# Patient Record
Sex: Female | Born: 1955 | Race: White | Hispanic: No | Smoking: Current every day smoker
Health system: Southern US, Community
[De-identification: ages and names within clinical notes are randomized; demographics above are authoritative.]

## PROBLEM LIST (undated history)

## (undated) DIAGNOSIS — F419 Anxiety disorder, unspecified: Secondary | ICD-10-CM

## (undated) DIAGNOSIS — J45909 Unspecified asthma, uncomplicated: Secondary | ICD-10-CM

## (undated) DIAGNOSIS — F1911 Other psychoactive substance abuse, in remission: Secondary | ICD-10-CM

## (undated) DIAGNOSIS — R0600 Dyspnea, unspecified: Secondary | ICD-10-CM

## (undated) DIAGNOSIS — I10 Essential (primary) hypertension: Secondary | ICD-10-CM

## (undated) DIAGNOSIS — R011 Cardiac murmur, unspecified: Secondary | ICD-10-CM

## (undated) DIAGNOSIS — E079 Disorder of thyroid, unspecified: Secondary | ICD-10-CM

## (undated) DIAGNOSIS — E039 Hypothyroidism, unspecified: Secondary | ICD-10-CM

## (undated) DIAGNOSIS — F319 Bipolar disorder, unspecified: Secondary | ICD-10-CM

## (undated) DIAGNOSIS — B1920 Unspecified viral hepatitis C without hepatic coma: Secondary | ICD-10-CM

## (undated) DIAGNOSIS — J189 Pneumonia, unspecified organism: Secondary | ICD-10-CM

## (undated) DIAGNOSIS — J449 Chronic obstructive pulmonary disease, unspecified: Secondary | ICD-10-CM

## (undated) HISTORY — PX: TONSILLECTOMY: SUR1361

## (undated) HISTORY — PX: CHOLECYSTECTOMY: SHX55

## (undated) HISTORY — DX: Essential (primary) hypertension: I10

## (undated) HISTORY — DX: Unspecified viral hepatitis C without hepatic coma: B19.20

## (undated) HISTORY — PX: TUBAL LIGATION: SHX77

## (undated) HISTORY — DX: Other psychoactive substance abuse, in remission: F19.11

## (undated) HISTORY — DX: Unspecified asthma, uncomplicated: J45.909

## (undated) HISTORY — DX: Anxiety disorder, unspecified: F41.9

## (undated) HISTORY — DX: Bipolar disorder, unspecified: F31.9

---

## 2005-11-25 ENCOUNTER — Emergency Department (HOSPITAL_COMMUNITY): Admission: EM | Admit: 2005-11-25 | Discharge: 2005-11-25 | Payer: Self-pay | Admitting: Emergency Medicine

## 2005-12-16 ENCOUNTER — Emergency Department (HOSPITAL_COMMUNITY): Admission: EM | Admit: 2005-12-16 | Discharge: 2005-12-16 | Payer: Self-pay | Admitting: Emergency Medicine

## 2005-12-20 ENCOUNTER — Emergency Department (HOSPITAL_COMMUNITY): Admission: EM | Admit: 2005-12-20 | Discharge: 2005-12-21 | Payer: Self-pay | Admitting: Emergency Medicine

## 2005-12-23 ENCOUNTER — Emergency Department (HOSPITAL_COMMUNITY): Admission: EM | Admit: 2005-12-23 | Discharge: 2005-12-23 | Payer: Self-pay | Admitting: Emergency Medicine

## 2009-09-01 ENCOUNTER — Other Ambulatory Visit: Admission: RE | Admit: 2009-09-01 | Discharge: 2009-09-01 | Payer: Self-pay | Admitting: Unknown Physician Specialty

## 2010-11-26 NOTE — Consult Note (Signed)
Monica [name], Monica [name]                 ACCOUNT NO.:  [id number]   MEDICAL RECORD NO.:  [id number]          PATIENT TYPE:  EMS   LOCATION:  ED                            FACILITY:  [facility]   PHYSICIAN:  Tilda Burrow, M.D. DATE OF BIRTH:  10-Nov-1955   DATE OF CONSULTATION:  DATE OF DISCHARGE:                                   CONSULTATION   CHIEF COMPLAINT:  Vaginal bleeding/fibroids.   HISTORY OF PRESENT ILLNESS:  This 55 year old female, with a 2-1/2 month  history of abdominal pain and discomfort, has been seen in the emergency  room several times, both at [facility] and [facility], as  well as single visit to [facility] 3+ weeks ago, to see Dr. Despina Hidden for  complaints of irregular bleeding which has been going on for 5 weeks now.  She perceives herself to be in a normal state of overall decent health up  until 2-1/2 months ago.  She began a period 5 weeks ago with irregular  bleeding.  The historian is the patient, who is garrulous and does not  appreciate being interrupted for clarification.  She has been seen at  [facility], as well.  Most recently, she was seen by Dr.  Despina Hidden 3 weeks ago.  She was treated with Megace for dysfunctional bleeding.  She claims to have lost her job at [employer] recently due to bleeding  and anemia.  She has begun to seek follow up at [facility].  She  made a follow up appointment today, and is scheduled to see Dr. Despina Hidden in 2  days.  She was seen Sunday at [facility] where an ultrasound was  performed.  Report not available at this time, but the patient  reports that  she was told she had fibroids.  She called this evening at the end of the  work day requesting pain medicines be called in other than Vicodin.   PAST MEDICAL HISTORY:  1.  Positive for hepatitis C, not being managed by medical personnel at this      time.  2.  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  3.  She  does give additional medical history of having elevated thyroid      function, not under any current therapy.   SURGICAL HISTORY:  Negative.   MEDICATIONS:  1.  Klonopin 1 mg twice daily.  2.  Lexapro 10 mg once daily.  3.  Hydrocodone one every 4 hours as needed.  4.  Vicodin.  5.  Megace.  6.  Megestrol 40 mg daily.  7.  Voltaren 100 mg one a day.   SOCIAL HISTORY:  The patient is a heavy smoker, over 2 packs per day.  She  has a history of drug addiction until 22 months ago.  She is reportedly  clean.  She denies the use of any drug, other than cocaine.  She does not  drink currently.   PHYSICAL EXAMINATION:  GENERAL:  A thin, somber Caucasian female, tanned,  who appears older tan her stated  age.  Estimated white count 130.  HEENT:  Pupils equal, round and reactive.  Poor dentition with multiple  teeth removed from the lower jaw.  NECK:  Supple.  No thyromegaly.  Breathing unlabored.  ABDOMEN:  Soft, nontender, without any masses or guarding.  PELVIC:  Exam performed by Dr. Margretta Ditty reported as normal external  genitalia, non-purulent cervix, light blood per vagina with uterus not  palpably enlarged on a patient whose size allows her to be easy to be  examined.  Exam does not corroborate with her complaints of discomfort.   LABORATORY DATA:  Normal liver function tests, normal white counts,  hemoglobin 13, hematocrit 37.9, white count 7,800.  Albumin is 4.0.  SGOT  38,  minimally elevated.  SGPT 41, minimally elevated.   IMPRESSION:  1.  Dysfunctional bleeding.  2.  Pain complaints out of proportion to physical findings.   PLAN:  Give the patient Tylox one every 6 hours x20 tablets.  The patient is  to discontinue Megace at this time x1 week, then restart if necessary.  A  follow up appointment previously scheduled for Dr. Despina Hidden in 2 days can be  postponed 1 week if she is doing well.  Follow up with Dr. Despina Hidden.  Also, the  patient advised to request ultrasound records from  December 18, 2005 at [facility], and get those delivered to [facility].      Tilda Burrow, M.D.  Electronically Signed     JVF/MEDQ  D:  12/21/2005  T:  12/21/2005  Job:  [id number]

## 2014-07-14 LAB — LIPID PANEL
Cholesterol: 160 (ref 0–200)
HDL: 58 (ref 35–70)
LDL CALC: 91
Triglycerides: 57 (ref 40–160)

## 2017-05-30 ENCOUNTER — Ambulatory Visit (INDEPENDENT_AMBULATORY_CARE_PROVIDER_SITE_OTHER): Payer: Medicaid Other | Admitting: General Surgery

## 2017-05-30 ENCOUNTER — Encounter: Payer: Self-pay | Admitting: General Surgery

## 2017-05-30 VITALS — BP 109/64 | HR 112 | Temp 98.4°F | Resp 18 | Ht 61.0 in | Wt 113.0 lb

## 2017-05-30 DIAGNOSIS — K8050 Calculus of bile duct without cholangitis or cholecystitis without obstruction: Secondary | ICD-10-CM | POA: Diagnosis not present

## 2017-05-30 NOTE — Patient Instructions (Signed)
You will be referred to Dr. Mannie Stabile for a new PCP. You will need further workup prior to getting any gallbladder surgery, including an MRI and possibly labs to look at your liver.   Cholelithiasis Cholelithiasis is also called "gallstones." It is a kind of gallbladder disease. The gallbladder is an organ that stores a liquid (bile) that helps you digest fat. Gallstones may not cause symptoms (may be silent gallstones) until they cause a blockage, and then they can cause pain (gallbladder attack). Follow these instructions at home:  Take over-the-counter and prescription medicines only as told by your doctor.  Stay at a healthy weight.  Eat healthy foods. This includes: ? Eating fewer fatty foods, like fried foods. ? Eating fewer refined carbs (refined carbohydrates). Refined carbs are breads and grains that are highly processed, like white bread and white rice. Instead, choose whole grains like whole-wheat bread and brown rice. ? Eating more fiber. Almonds, fresh fruit, and beans are healthy sources of fiber.  Keep all follow-up visits as told by your doctor. This is important. Contact a doctor if:  You have sudden pain in the upper right side of your belly (abdomen). Pain might spread to your right shoulder or your chest. This may be a sign of a gallbladder attack.  You feel sick to your stomach (are nauseous).  You throw up (vomit).  You have been diagnosed with gallstones that have no symptoms and you get: ? Belly pain. ? Discomfort, burning, or fullness in the upper part of your belly (indigestion). Get help right away if:  You have sudden pain in the upper right side of your belly, and it lasts for more than 2 hours.  You have belly pain that lasts for more than 5 hours.  You have a fever or chills.  You keep feeling sick to your stomach or you keep throwing up.  Your skin or the whites of your eyes turn yellow (jaundice).  You have dark-colored pee (urine).  You have  light-colored poop (stool). Summary  Cholelithiasis is also called "gallstones."  The gallbladder is an organ that stores a liquid (bile) that helps you digest fat.  Silent gallstones are gallstones that do not cause symptoms.  A gallbladder attack may cause sudden pain in the upper right side of your belly. Pain might spread to your right shoulder or your chest. If this happens, contact your doctor.  If you have sudden pain in the upper right side of your belly that lasts for more than 2 hours, get help right away. This information is not intended to replace advice given to you by your health care provider. Make sure you discuss any questions you have with your health care provider. Document Released: 12/14/2007 Document Revised: 03/13/2016 Document Reviewed: 03/13/2016 Elsevier Interactive Patient Education  2017 [employer].

## 2017-05-31 ENCOUNTER — Encounter: Payer: Self-pay | Admitting: General Surgery

## 2017-05-31 NOTE — Progress Notes (Signed)
[facility] History and Physical  Reason for Referral:. Gallstones  Referring Physician: Neale Burly, MD   Chief Complaint    Cholelithiasis      Monica [name] is a 61 y.o. female.  HPI: Monica [name] is a 61 yo with complaints of chronic abdominal pain, but on speaking with her in my office she was preoccupied with multiple other issues and complaints. She was sent me to discuss possible gallbladder surgery given her [place] with stones, and on getting her full history, she reported multiple other issues that concerned her more than the gallbladder. She reports dragging her left leg chronically, reports a history of Hepatitis C and wanting to get treatment, and reports a 8-10 lb weight loss in the recent months.  She says she goes to the restroom with diarrhea over 15 times a day, and that she has vomited on occasion and has seen hematemesis once.  She has been seen by Monica [name] for many years, but is worried that she needs a second opinion on some of her chronic health issues.    I tried to explain today that she was at my office to discuss her gallbladder, but it was difficult to redirect the conversation as she was very preoccupied with the other health issues.  She reports that she has never had a colonoscopy and I am not seeing a documented history of one in the notes from Monica [name].   Past Medical History:  Diagnosis Date  . Anxiety   . Asthma   . Hepatitis C   . Hypertension     Past Surgical History:  Procedure Laterality Date  . TUBAL LIGATION      Family History  Problem Relation Age of Onset  . Lung cancer Mother   . Heart disease Father   . Hypertension Sister   . Diabetes Maternal Grandmother   . Miscarriages / Stillbirths Maternal Grandmother     Social History   Tobacco Use  . Smoking status: Current Every Day Smoker  . Smokeless tobacco: Never Used  Substance Use Topics  . Alcohol use: Yes    Comment: occasional  . Drug use: No     Medications: I have reviewed the patient's current medications. Prior to Admission:  (Not in a hospital admission) Allergies as of 05/30/2017   Not on File     Medication List        Accurate as of 05/30/17 11:59 PM. Always use your most recent med list.          escitalopram 10 MG tablet Commonly known as:  LEXAPRO Take 10 mg by mouth daily.   gabapentin 300 MG capsule Commonly known as:  NEURONTIN Take 300 mg by mouth 3 (three) times daily.   levothyroxine 25 MCG tablet Commonly known as:  SYNTHROID, LEVOTHROID Take 25 mcg by mouth daily.   lisinopril-hydrochlorothiazide 20-12.5 MG tablet Commonly known as:  PRINZIDE,ZESTORETIC Take 1 tablet by mouth daily.   loperamide 2 MG capsule Commonly known as:  IMODIUM Take 2 mg by mouth 2 (two) times daily as needed.   PROAIR HFA 108 (90 Base) MCG/ACT inhaler Generic drug:  albuterol INHALE TWO PUFFS BY MOUTH EVERY 4 TO 6 HOURS AS NEEDED        ROS:  A comprehensive review of systems was negative except for: Respiratory: positive for SOB Cardiovascular: positive for chest pain and panic attacks Gastrointestinal: positive for abdominal pain, diarrhea, nausea, reflux symptoms and vomiting Genitourinary: positive for dysuria and  frequency Musculoskeletal: positive for back pain, bone pain, neck pain, stiff joints and dragging left leg?  Endocrine: positive for tired, excessive thrist, cold intolerance  Blood pressure 109/64, pulse (!) 112, temperature 98.4 F (36.9 C), resp. rate 18, height 5\' 1"  (1.549 m), weight 113 lb (51.3 kg). Physical Exam  Constitutional: She is oriented to person, place, and time. She appears distressed.  Thin lady, anxious  HENT:  Head: Normocephalic.  Eyes: Pupils are equal, round, and reactive to light.  Cardiovascular: Normal rate and regular rhythm.  Pulmonary/Chest: Effort normal and breath sounds normal.  Abdominal: Soft. She exhibits no distension. There is no tenderness.   Musculoskeletal: Normal range of motion.  Neurological: She is alert and oriented to person, place, and time.  Skin: Skin is warm and dry.  Psychiatric: Mood, memory, affect and judgment normal.  Vitals reviewed.   Results:     No LFTs sent / obtained from PCP  Assessment & Plan:  Monica [name] is a 61 y.o. female with multiple complaints and concerns who was sent to me for cholelithiasis but had anxiety about a multitude of other issues that need to be addressed by a primary physician.  On reviewing her history and [place] report, she does have stones a very dilated CBD to 9.48mm and no obvious stones or other lesions with her liver or pancreas.  I am concerned with her weight loss and her smoking history, diarrhea (Could be some degree of pancreatic insufficiency) and general frailty.  She also has multiple other complaints that I cannot address today, but she would like to be referred to a second opinion for a primary care provider.    -I have given her a list of options for PCPs in the [place] area that are taking patients, a referral was made to Monica [name] and I will let Monica [name] know about the patient's request for the referral  -Given the CBD diameter and other issues, MRCP will be ordered to ensure no additional findings concerning for a pancreatic cancer or choledocholithiasis  -Will hold on labs at this time, as Monica [name] is likely to obtain studies at her future appointment, and I will ask her to include LFTs at that time  -I will see the patient back in the future, once she has the MRCP and has been evaluated by Monica [name] for her other complaints which she was more preoccupied about   Future Appointments  Date Time Provider [place]  06/13/2017  9:00 AM Monica Macadam, MD [facility]    All questions were answered to the satisfaction of the patient.   Monica [name] 05/31/2017, 9:30 AM

## 2017-06-13 ENCOUNTER — Encounter: Payer: Self-pay | Admitting: Family Medicine

## 2017-06-13 ENCOUNTER — Ambulatory Visit (INDEPENDENT_AMBULATORY_CARE_PROVIDER_SITE_OTHER): Payer: Medicaid Other | Admitting: Family Medicine

## 2017-06-13 ENCOUNTER — Other Ambulatory Visit: Payer: Self-pay

## 2017-06-13 VITALS — BP 110/70 | HR 90 | Temp 98.2°F | Resp 16 | Ht 61.0 in | Wt 111.8 lb

## 2017-06-13 DIAGNOSIS — R197 Diarrhea, unspecified: Secondary | ICD-10-CM

## 2017-06-13 DIAGNOSIS — Z720 Tobacco use: Secondary | ICD-10-CM | POA: Diagnosis not present

## 2017-06-13 DIAGNOSIS — R5383 Other fatigue: Secondary | ICD-10-CM

## 2017-06-13 DIAGNOSIS — Z23 Encounter for immunization: Secondary | ICD-10-CM

## 2017-06-13 DIAGNOSIS — I10 Essential (primary) hypertension: Secondary | ICD-10-CM

## 2017-06-13 DIAGNOSIS — C44301 Unspecified malignant neoplasm of skin of nose: Secondary | ICD-10-CM

## 2017-06-13 DIAGNOSIS — Z9189 Other specified personal risk factors, not elsewhere classified: Secondary | ICD-10-CM

## 2017-06-13 DIAGNOSIS — E039 Hypothyroidism, unspecified: Secondary | ICD-10-CM | POA: Diagnosis not present

## 2017-06-13 DIAGNOSIS — B1920 Unspecified viral hepatitis C without hepatic coma: Secondary | ICD-10-CM

## 2017-06-13 NOTE — Progress Notes (Signed)
Patient ID: Monica [name], female    DOB: 12-16-1955, 61 y.o.   MRN: [id number]  Chief Complaint  Patient presents with  . Hepatitis C  . Thyroid Problem  . Hypertension    Allergies Patient has no allergy information on record.  Subjective:   Monica [name] is a 61 y.o. female who presents to [facility] today.  HPI Monica [name] presents as a new patient visit to our clinic.  She reports that approximately 10 years ago she found out she had hepatitis C viral infection.  She reports that she has not had any evaluation or treatment since that time.  She presents today after seeing Dr. Constance Haw for questionable gallstones versus common bile dilatation because she is in need of a new primary care physician.  She reports she is very concerned about her health and would like to make some good changes for her health and well-being.  She reports she is motivated to quit smoking and wants to do what she can to live longer.  However, she is not interested in quitting smoking today.  Reports she has been hypothyroid for many years.  Has been on thyroid medication each day as directed.  Is not sure when she had her last lab values checked but believes they were normal.  Reports that she has lost weight over the past several months.  Can feel anxious at times.  Has had diarrhea for the past 3 months multiple times a day.  Has had a diagnosis of hypertension for a long time.  Reports compliance with her medication.  Denies any known heart attack, stroke, or kidney disease secondary to blood pressure.  Denies any side effects with her medication.  Takes Lexapro for her mood.  Does not feel depressed but believes that it helps with anxiety.  Denies any suicidal or homicidal ideations.  Sleeps fairly well.  Energy is fair.  Denies problems with concentration.  Enjoys doing things with family and especially her grandchildren.  Appetite is lower than usual.  Reports that has always been able to eat  whatever she wants and not gain weight.  Does not do any formal exercise.  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  Has smoked for over 40 years greater than or equal to a pack per day.  Has tried Chantix in the past to quit but had significant psychiatric side effects with the medication.  Has used the nicotine replacement patch in the past but no success.  Has tried to quit multiple times.  Does not feel that this is the right time to quit because she is very stressed about her health.  Currently being evaluated by Dr. Constance Haw for gallstones and questionable area in her Bile duct.    Thyroid Problem  Presents for follow-up visit. Symptoms include diarrhea, fatigue and weight loss. Patient reports no constipation, depressed mood, diaphoresis, dry skin, hair loss, heat intolerance, hoarse voice, leg swelling, nail problem, palpitations, tremors, visual change or weight gain. The symptoms have been stable.  Hypertension  This is a chronic problem. The current episode started more than 1 year ago. The problem has been resolved since onset. The problem is controlled. Associated symptoms include anxiety. Pertinent negatives include no blurred vision, chest pain, headaches, malaise/fatigue, neck pain, orthopnea, palpitations, peripheral edema, PND, shortness of breath or sweats. There are no associated agents to hypertension. Risk factors for coronary artery disease include family history, post-menopausal state, smoking/tobacco exposure, stress and  sedentary lifestyle. Past treatments include ACE inhibitors and diuretics. The current treatment provides moderate improvement. Compliance problems include exercise.  There is no history of angina, kidney disease, CVA or PVD.  Anxiety  Presents for follow-up visit. Symptoms include insomnia. Patient reports no chest pain, confusion, decreased concentration, depressed mood, dizziness, dry mouth, excessive worry, hyperventilation,  irritability, malaise, palpitations, panic, restlessness, shortness of breath or suicidal ideas. Symptoms occur occasionally. The severity of symptoms is mild. The quality of sleep is fair. Nighttime awakenings: occasional.   Compliance with medications is 76-100%.  Nicotine Dependence  Presents for initial visit. Symptoms include cravings, fatigue, headache and insomnia. Symptoms are negative for decreased concentration, irritability and sore throat. (Currently no symptoms because he is currently smoking.  However reports she gets these symptoms when she tries to quit.) Preferred tobacco types include cigarettes. Preferred cigarette types include filtered. Preferred strength is light. Preferred cigarettes are non-menthol. Her urge triggers include company of smokers. Her first smoke is before 6 AM. She smokes 1 pack of cigarettes per day. She started smoking when she was 49-36 years old. Past treatments include varenicline, nicotine patch and nicotine gum. The treatment provided minimal relief. Compliance with prior treatments has been variable. Monica [name] is thinking about quitting. Monica [name] has tried to quit 5 or more times. There is no history of alcohol abuse and drug use.    Past Medical History:  Diagnosis Date  . Anxiety   . Asthma   . Hepatitis C   . Hypertension     Past Surgical History:  Procedure Laterality Date  . TUBAL LIGATION      Family History  Problem Relation Age of Onset  . Lung cancer Mother   . Heart disease Father   . Hypertension Sister   . Diabetes Maternal Grandmother   . Stroke Maternal Grandmother      Social History   Socioeconomic History  . Marital status: Divorced    Spouse name: None  . Number of children: None  . Years of education: None  . Highest education level: None  Social Needs  . Financial resource strain: None  . Food insecurity - worry: None  . Food insecurity - inability: None  . Transportation needs - medical: None  . Transportation  needs - non-medical: None  Occupational History  . None  Tobacco Use  . Smoking status: Current Every Day Smoker  . Smokeless tobacco: Never Used  Substance and Sexual Activity  . Alcohol use: Yes    Comment: occasional  . Drug use: No  . Sexual activity: None  Other Topics Concern  . None  Social History Narrative   Has two children. Divorced. Lives alone in [place]. Worked Architect. Smokes cigarettes. Eats all food groups.    Current Outpatient Medications on File Prior to Visit  Medication Sig Dispense Refill  . escitalopram (LEXAPRO) 10 MG tablet Take 10 mg by mouth daily.  0  . gabapentin (NEURONTIN) 300 MG capsule Take 300 mg by mouth 3 (three) times daily.  2  . levothyroxine (SYNTHROID, LEVOTHROID) 25 MCG tablet Take 25 mcg by mouth daily.  0  . lisinopril-hydrochlorothiazide (PRINZIDE,ZESTORETIC) 20-12.5 MG tablet Take 1 tablet by mouth daily.  0  . PROAIR HFA 108 (90 Base) MCG/ACT inhaler INHALE TWO PUFFS BY MOUTH EVERY 4 TO 6 HOURS AS NEEDED  2  . loperamide (IMODIUM) 2 MG capsule Take 2 mg by mouth 2 (two) times daily as needed.  0   No current facility-administered medications on file  prior to visit.     Review of Systems  Constitutional: Positive for fatigue, unexpected weight change and weight loss. Negative for chills, diaphoresis, fever, irritability, malaise/fatigue and weight gain.  HENT: Negative for hoarse voice, nosebleeds, sore throat and trouble swallowing.   Eyes: Negative for blurred vision and visual disturbance.  Respiratory: Negative for cough, choking, shortness of breath, wheezing and stridor.   Cardiovascular: Negative for chest pain, palpitations, orthopnea and PND.  Gastrointestinal: Positive for abdominal pain and diarrhea. Negative for abdominal distention and constipation.       Had black tarry stools three months ago. Has occasional nausea and vomiting. Has increased fiber diet. Diarrhea is occurring multiple times a day.   Endocrine:  Negative for heat intolerance.  Genitourinary: Negative for dysuria, frequency and hematuria.  Musculoskeletal: Negative for neck pain.  Skin: Negative for rash.  Neurological: Negative for dizziness, tremors and headaches.  Hematological: Negative for adenopathy. Does not bruise/bleed easily.  Psychiatric/Behavioral: Negative for confusion, decreased concentration, hallucinations, self-injury and suicidal ideas. The patient has insomnia. The patient is not hyperactive.      Objective:   BP 110/70 (BP Location: Left Arm, Patient Position: Sitting, Cuff Size: Normal)   Pulse 90   Temp 98.2 F (36.8 C) (Other (Comment))   Resp 16   Ht 5\' 1"  (1.549 m)   Wt 111 lb 12 oz (50.7 kg)   SpO2 97%   BMI 21.11 kg/m   Physical Exam  Constitutional: She is oriented to person, place, and time. She appears well-developed.  Thin white female with evidence of photodamage, in no acute distress.  HENT:  Head: Normocephalic and atraumatic.  Eyes: EOM are normal. Pupils are equal, round, and reactive to light. No scleral icterus.  Neck: Normal range of motion. Neck supple. No JVD present. No tracheal deviation present. No thyromegaly present.  Cardiovascular: Normal rate, regular rhythm and normal heart sounds.  No murmur heard. Heart rate 89  Pulmonary/Chest: Effort normal and breath sounds normal. She has no wheezes.  Abdominal: Soft. Bowel sounds are normal. She exhibits no distension.  Mild right upper quadrant tenderness to palpation.  No rebound or guarding.  No hepatosplenomegaly on exam.  Musculoskeletal: Normal range of motion. She exhibits no edema.  Lymphadenopathy:    She has no cervical adenopathy.  Neurological: She is alert and oriented to person, place, and time.  Hyperactive patellar reflexes.  Skin: Skin is warm.  Nose with scar on frontal bridge consistent with previous surgery.  Psychiatric: She has a normal mood and affect. Her behavior is normal. Judgment and thought  content normal.     Assessment and Plan  1. Hepatitis C virus infection without hepatic coma, unspecified chronicity;  Patient with history of hepatitis C virus with no prior treatment.  Check labs and referral to gastroenterology. - Ambulatory referral to Gastroenterology - Hepatitis C Antibody - Hepatitis panel, acute - HIV antibody (with reflex) - Hepatitis C RNA quantitative - Hepatic function panel  2. Fatigue, unspecified type  - CBC with Differential/Platelet  3. At high risk for infection Check HIV and screening for hep a and B.  With diagnosis of hepatitis C, will plan to start the Twinrix vaccination at follow-up.  4. Hypothyroidism, unspecified type Patient with symptoms of hyperthyroidism, but possibly due to other metabolic problems.  Check thyroid function at this time and adjust medication as needed. - TSH  5. Skin cancer of nose History of skin cancer status post biopsy with evidence of photo damage.  Referral to dermatology for screening and evaluation. - Ambulatory referral to Dermatology  6. HTN, goal below 140/90 Stable.  Continue current medications. - Basic metabolic panel  7. Tobacco abuse The 5 A's Model for treating Tobacco Use and Dependence was used today. I have identified and documented tobacco use status for this patient. I have urged the patient to quit tobacco use. At this time, the patient is unwilling and not ready to attempt to quit. I have provided patient with information regarding risks, cessation techniques, and interventions that might increase future attempts to quit smoking. I will plan on again addressing tobacco dependence at the next visit.  - CT CHEST LUNG CA SCREEN LOW DOSE W/O CM; Future Patient does wish to proceed with a CT scan of the chest for lung cancer screening.  We discussed this today and its implications.  She is interested in getting this test at this time. 8. Immunization due  - Flu Vaccine QUAD 6+ mos PF IM (Fluarix  Quad PF) - Pneumococcal conjugate vaccine 13-valent IM  9. Diarrhea, unspecified type Patient with diarrhea for the past 3 months with weight loss and questionable bile duct abnormality/stones.  Awaiting MRCP.  At this time patient definitely needs gastrointestinal evaluation including colonoscopy and evaluation by GI.  Discussed this with patient in detail.  Labs ordered.  Wishes to wait on stool samples at this time. - Ambulatory referral to Gastroenterology  Return in about 3 weeks (around 07/04/2017) for follow up. Caren Macadam, MD 06/13/2017

## 2017-06-16 ENCOUNTER — Telehealth: Payer: Self-pay | Admitting: *Deleted

## 2017-06-16 NOTE — Telephone Encounter (Signed)
Patient called left message wanting Dr Mannie Stabile to call her regarding her health issues. Please advise [phone]
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
## 2017-06-20 NOTE — Telephone Encounter (Signed)
Please call and check on patient. Monica [name]. Mannie Stabile, MD

## 2017-06-20 NOTE — Telephone Encounter (Signed)
Called patient regarding message below. No answer, left generic message for patient to return call.   

## 2017-06-21 NOTE — Telephone Encounter (Signed)
Called patient regarding message below. No answer, left generic message for patient to return call.   

## 2017-06-21 NOTE — Telephone Encounter (Signed)
I have been unable to reach this patient by phone.  A letter is being sent.

## 2017-06-22 ENCOUNTER — Telehealth: Payer: Self-pay | Admitting: Family Medicine

## 2017-06-22 ENCOUNTER — Ambulatory Visit (HOSPITAL_COMMUNITY)
Admission: RE | Admit: 2017-06-22 | Discharge: 2017-06-22 | Disposition: A | Discharge status: Home / Self Care | Payer: Medicaid Other | Source: Ambulatory Visit | Attending: Family Medicine | Admitting: Family Medicine

## 2017-06-22 DIAGNOSIS — J439 Emphysema, unspecified: Secondary | ICD-10-CM | POA: Diagnosis not present

## 2017-06-22 DIAGNOSIS — I700 Atherosclerosis of aorta: Secondary | ICD-10-CM | POA: Diagnosis not present

## 2017-06-22 DIAGNOSIS — K8020 Calculus of gallbladder without cholecystitis without obstruction: Secondary | ICD-10-CM | POA: Insufficient documentation

## 2017-06-22 DIAGNOSIS — Z122 Encounter for screening for malignant neoplasm of respiratory organs: Secondary | ICD-10-CM | POA: Diagnosis present

## 2017-06-22 DIAGNOSIS — Z87891 Personal history of nicotine dependence: Secondary | ICD-10-CM | POA: Diagnosis present

## 2017-06-22 DIAGNOSIS — Z720 Tobacco use: Secondary | ICD-10-CM

## 2017-06-22 NOTE — Telephone Encounter (Signed)
Patient called and left a message on the nurse line returning my call. In the previous telephone message, I called patient to see how she was following a trip to the ED at our advice. She stated in her message that she is having her CT today and bloodwork. She is feeling much better and will be here for her appt on 12/18

## 2017-06-24 LAB — CBC WITH DIFFERENTIAL/PLATELET
BASOS ABS: 111 {cells}/uL (ref 0–200)
Basophils Relative: 1.4 %
Eosinophils Absolute: 403 cells/uL (ref 15–500)
Eosinophils Relative: 5.1 %
HEMATOCRIT: 41.8 % (ref 35.0–45.0)
HEMOGLOBIN: 14.6 g/dL (ref 11.7–15.5)
LYMPHS ABS: 3721 {cells}/uL (ref 850–3900)
MCH: 30.6 pg (ref 27.0–33.0)
MCHC: 34.9 g/dL (ref 32.0–36.0)
MCV: 87.6 fL (ref 80.0–100.0)
MPV: 11.3 fL (ref 7.5–12.5)
Monocytes Relative: 4.7 %
NEUTROS PCT: 41.7 %
Neutro Abs: 3294 cells/uL (ref 1500–7800)
Platelets: 246 10*3/uL (ref 140–400)
RBC: 4.77 10*6/uL (ref 3.80–5.10)
RDW: 13.2 % (ref 11.0–15.0)
Total Lymphocyte: 47.1 %
WBC: 7.9 10*3/uL (ref 3.8–10.8)
WBCMIX: 371 {cells}/uL (ref 200–950)

## 2017-06-24 LAB — HEPATITIS C RNA QUANTITATIVE
HCV QUANT LOG: 7.24 {Log_IU}/mL — AB
HCV RNA, PCR, QN: 17300000 IU/mL — ABNORMAL HIGH

## 2017-06-24 LAB — HEPATITIS PANEL, ACUTE
HEP B C IGM: NONREACTIVE
HEP C AB: REACTIVE — AB
Hep A IgM: NONREACTIVE
Hepatitis B Surface Ag: NONREACTIVE
SIGNAL TO CUT-OFF: 31.9 — AB (ref <1.00)

## 2017-06-24 LAB — HEPATIC FUNCTION PANEL
AG RATIO: 1.3 (calc) (ref 1.0–2.5)
ALBUMIN MSPROF: 4.3 g/dL (ref 3.6–5.1)
ALT: 57 U/L — ABNORMAL HIGH (ref 6–29)
AST: 64 U/L — AB (ref 10–35)
Alkaline phosphatase (APISO): 70 U/L (ref 33–130)
BILIRUBIN DIRECT: 0.1 mg/dL (ref 0.0–0.2)
BILIRUBIN TOTAL: 0.6 mg/dL (ref 0.2–1.2)
Globulin: 3.2 g/dL (calc) (ref 1.9–3.7)
Indirect Bilirubin: 0.5 mg/dL (calc) (ref 0.2–1.2)
Total Protein: 7.5 g/dL (ref 6.1–8.1)

## 2017-06-24 LAB — BASIC METABOLIC PANEL
BUN: 9 mg/dL (ref 7–25)
CO2: 23 mmol/L (ref 20–32)
CREATININE: 0.8 mg/dL (ref 0.50–1.05)
Calcium: 9.6 mg/dL (ref 8.6–10.4)
Chloride: 107 mmol/L (ref 98–110)
Glucose, Bld: 86 mg/dL (ref 65–139)
POTASSIUM: 3.8 mmol/L (ref 3.5–5.3)
SODIUM: 140 mmol/L (ref 135–146)

## 2017-06-24 LAB — HIV ANTIBODY (ROUTINE TESTING W REFLEX): HIV: NONREACTIVE

## 2017-06-24 LAB — TSH: TSH: 6.43 m[IU]/L — AB (ref 0.40–4.50)

## 2017-06-26 ENCOUNTER — Encounter: Payer: Self-pay | Admitting: Family Medicine

## 2017-06-26 ENCOUNTER — Telehealth: Payer: Self-pay | Admitting: Family Medicine

## 2017-06-26 NOTE — Telephone Encounter (Signed)
Pt called and left number [phone] to call her back, there was no answer, and no voicemail to leave a message.

## 2017-06-27 ENCOUNTER — Ambulatory Visit: Payer: Medicaid Other | Admitting: Family Medicine

## 2017-06-27 ENCOUNTER — Telehealth: Payer: Self-pay | Admitting: Family Medicine

## 2017-06-27 NOTE — Telephone Encounter (Signed)
Pt needs something to settle your stomach , gall bladder first of the new year, wanted something to get thru,,[facility] in [place] , or call pt

## 2017-06-27 NOTE — Telephone Encounter (Signed)
Please call PT she has an appt this morning 10.45 please call her [phone], she said that she had more test to do ..and she hasnt had them yet--wants to know if she needs to cancel

## 2017-06-27 NOTE — Telephone Encounter (Signed)
Spoke to patient, advised of her need to come in today per Dr. Mannie Stabile, patient will be here.

## 2017-06-29 ENCOUNTER — Encounter (INDEPENDENT_AMBULATORY_CARE_PROVIDER_SITE_OTHER): Payer: Self-pay | Admitting: Internal Medicine

## 2017-06-29 ENCOUNTER — Ambulatory Visit (INDEPENDENT_AMBULATORY_CARE_PROVIDER_SITE_OTHER): Payer: Medicaid Other | Admitting: Internal Medicine

## 2017-06-30 ENCOUNTER — Encounter: Payer: Self-pay | Admitting: Family Medicine

## 2017-06-30 DIAGNOSIS — C4441 Basal cell carcinoma of skin of scalp and neck: Secondary | ICD-10-CM | POA: Insufficient documentation

## 2017-06-30 DIAGNOSIS — J209 Acute bronchitis, unspecified: Secondary | ICD-10-CM | POA: Insufficient documentation

## 2017-06-30 DIAGNOSIS — G2581 Restless legs syndrome: Secondary | ICD-10-CM | POA: Insufficient documentation

## 2017-06-30 DIAGNOSIS — E039 Hypothyroidism, unspecified: Secondary | ICD-10-CM | POA: Insufficient documentation

## 2017-06-30 DIAGNOSIS — C44321 Squamous cell carcinoma of skin of nose: Secondary | ICD-10-CM | POA: Insufficient documentation

## 2017-07-14 ENCOUNTER — Other Ambulatory Visit: Payer: Self-pay

## 2017-07-14 ENCOUNTER — Telehealth: Payer: Self-pay

## 2017-07-14 ENCOUNTER — Encounter: Payer: Self-pay | Admitting: Family Medicine

## 2017-07-14 ENCOUNTER — Ambulatory Visit (INDEPENDENT_AMBULATORY_CARE_PROVIDER_SITE_OTHER): Payer: Medicaid Other | Admitting: Family Medicine

## 2017-07-14 VITALS — BP 110/68 | HR 110 | Temp 97.0°F | Resp 16 | Ht 61.0 in | Wt 123.2 lb

## 2017-07-14 DIAGNOSIS — F39 Unspecified mood [affective] disorder: Secondary | ICD-10-CM

## 2017-07-14 DIAGNOSIS — F1410 Cocaine abuse, uncomplicated: Secondary | ICD-10-CM

## 2017-07-14 DIAGNOSIS — B182 Chronic viral hepatitis C: Secondary | ICD-10-CM | POA: Diagnosis not present

## 2017-07-14 DIAGNOSIS — F419 Anxiety disorder, unspecified: Secondary | ICD-10-CM

## 2017-07-14 DIAGNOSIS — R197 Diarrhea, unspecified: Secondary | ICD-10-CM

## 2017-07-14 DIAGNOSIS — E039 Hypothyroidism, unspecified: Secondary | ICD-10-CM | POA: Diagnosis not present

## 2017-07-14 MED ORDER — LEVOTHYROXINE SODIUM 50 MCG PO TABS
50.0000 ug | ORAL_TABLET | Freq: Every day | ORAL | 3 refills | Status: DC
Start: 2017-07-14 — End: 2017-08-17
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
## 2017-07-14 NOTE — Telephone Encounter (Signed)
[facility]  Writer spoke to the nurse at [facility] and the fax has been received and they are reviewing it.  Writer informed the nurse at [facility].   The nurse at [facility] has the phone number for the patient and she will be able to follow up with the patient regarding the substance abuse program.

## 2017-07-17 ENCOUNTER — Telehealth: Payer: Self-pay | Admitting: Family Medicine

## 2017-07-17 NOTE — Telephone Encounter (Signed)
Patient is requesting a call from [place]. She wants to know if she should take care of her colonoscopy and gallbladder before doing inpatient therapy for drug abuse  Cb#: [phone]  (*she is also requesting to speak to the same therapist she spoke to during her appt with DrHagler)

## 2017-07-18 NOTE — Telephone Encounter (Signed)
Please advise patient that she needs to get inpatient treatment for her drug use and then we will follow-up on her GI issues.

## 2017-07-18 NOTE — Telephone Encounter (Signed)
Left message, per DPR, informing.

## 2017-07-22 ENCOUNTER — Telehealth: Payer: Self-pay

## 2017-07-22 NOTE — Telephone Encounter (Signed)
[facility]   Writer left a voice mail message.  

## 2017-07-24 ENCOUNTER — Encounter: Payer: Self-pay | Admitting: Internal Medicine

## 2017-07-27 ENCOUNTER — Telehealth: Payer: Self-pay | Admitting: Family Medicine

## 2017-07-27 NOTE — Telephone Encounter (Signed)
Patient called in to the office to discuss a few things. She states that she switched PCPs because she thought Dr. Mannie Stabile was a good doctor, she liked her and still does but she doesn't understand how she is being take care of. She states she is sure that Dr. Mannie Stabile thinks she is a nut. She states when she came in last, she knows she was all over the place. She states she is having problems with her nerves. She states she is taking cymbalta but states she needs something to 'level her out.' She states Dr. Freida Busman told her she was borderline bipolar. She states she thinks she is full bipolar. She states she has had 'episodes' like this in the past. She states she is seeing bugs on her. She states she does not want mental health involved. She states she spoke with a counselor when she was in the office last. She states that she just needs something. She states that at the time, she thought she wanted inpatient rehab. She states she went to a few group meetings, but now doesn't want to. She states then, she wanted to focus on mental health, now she wants to focus on general health. She wants to see gastroenterologist and get her gallbladder checked out. She states she can't keep anything down and has been on the toilet for 4 days. She asked if there was any way to avoid gastroenterology and go straight for surgery. She repeatedly stated she was in tears. She states she has taken a milk bath for her nerves. She states she does not want narcotics, she just need something to level her out. She states she told Dr. Mannie Stabile she wanted to do things one at a time. She later stated she did not understand why she had a PCP if she could not have everything taken care of at once. She refused office visit or ED, stating she does not have a ride. She states she has not even gotten dressed yet and that she lives with her sister. She said she does not want to see her liver doctor, she does not want to worry about that until after her  gallbladder is taken care of. She states she feels swollen under her rib cage. She states again that she is seeing bugs like last time when she saw Dr. Freida Busman.   Patient's thoughts were very disconnected. She would not finish one thought before moving on to the next. She was speaking very rapidly and although she did not sound as though she was crying, she told me she was crying and had been crying. Patient's thoughts were erratic and it was hard to capture all the information she was giving me.   She gave me a number she could be reached at- [phone]  I called [facility] to check on status of referral. Patient has an appointment 09/06/17 at 8 am. They are mailing patient a letter. She is on the cancellation list to move up if someone else cancels an appointment.

## 2017-07-28 ENCOUNTER — Telehealth: Payer: Self-pay | Admitting: Family Medicine

## 2017-07-28 NOTE — Telephone Encounter (Signed)
I called at 8:22 this morning and requested a welfare check through the [facility]. I was informed that an office was dispatched and would call me after the check was complete.

## 2017-07-28 NOTE — Telephone Encounter (Signed)
Can we call the [place] office to go out and check on patient? She does not seem that her mood is stable. Please ask Rosaria Ferries how this works. Gwen Her. Mannie Stabile, MD

## 2017-07-28 NOTE — Telephone Encounter (Signed)
Pt is calling back in wanting to speak with you --her number to call her back is [phone]
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
## 2017-07-31 NOTE — Telephone Encounter (Signed)
Multiple note open for this situation. Closing this one.

## 2017-08-02 ENCOUNTER — Telehealth: Payer: Self-pay

## 2017-08-02 NOTE — Telephone Encounter (Signed)
[facility] - left message on 07/28/17 and 08/01/2017.

## 2017-08-15 ENCOUNTER — Telehealth: Payer: Self-pay

## 2017-08-15 NOTE — Telephone Encounter (Signed)
[facility] - Writer attempted to contact the patent in order to refer her to [facility] per Dr. Modesta Messing..  Writer left messages with the patient on 07/22/17; 07/28/2017; 08/01/2017; 08/02/2017 and 08/15/2017.    Patient will be taking off the schedule for Dr. Modesta Messing,.  Writer will continue to make calls in order to reach this patient.

## 2017-08-17 ENCOUNTER — Emergency Department (HOSPITAL_COMMUNITY): Payer: Medicaid Other

## 2017-08-17 ENCOUNTER — Encounter: Payer: Self-pay | Admitting: Family Medicine

## 2017-08-17 ENCOUNTER — Emergency Department (HOSPITAL_COMMUNITY)
Admission: EM | Admit: 2017-08-17 | Discharge: 2017-08-18 | Disposition: A | Discharge status: Home / Self Care | Payer: Medicaid Other | Attending: Emergency Medicine | Admitting: Emergency Medicine

## 2017-08-17 ENCOUNTER — Other Ambulatory Visit: Payer: Self-pay

## 2017-08-17 ENCOUNTER — Ambulatory Visit (INDEPENDENT_AMBULATORY_CARE_PROVIDER_SITE_OTHER): Payer: Medicaid Other | Admitting: Family Medicine

## 2017-08-17 ENCOUNTER — Ambulatory Visit (HOSPITAL_COMMUNITY): Payer: Medicaid Other | Admitting: Psychiatry

## 2017-08-17 ENCOUNTER — Encounter (HOSPITAL_COMMUNITY): Payer: Self-pay | Admitting: Emergency Medicine

## 2017-08-17 VITALS — BP 122/78 | HR 86 | Temp 97.9°F | Resp 16 | Ht 61.0 in | Wt 120.8 lb

## 2017-08-17 DIAGNOSIS — F17200 Nicotine dependence, unspecified, uncomplicated: Secondary | ICD-10-CM | POA: Diagnosis not present

## 2017-08-17 DIAGNOSIS — R443 Hallucinations, unspecified: Secondary | ICD-10-CM

## 2017-08-17 DIAGNOSIS — E039 Hypothyroidism, unspecified: Secondary | ICD-10-CM | POA: Diagnosis not present

## 2017-08-17 DIAGNOSIS — F1910 Other psychoactive substance abuse, uncomplicated: Secondary | ICD-10-CM | POA: Diagnosis not present

## 2017-08-17 DIAGNOSIS — F39 Unspecified mood [affective] disorder: Secondary | ICD-10-CM

## 2017-08-17 DIAGNOSIS — I10 Essential (primary) hypertension: Secondary | ICD-10-CM | POA: Insufficient documentation

## 2017-08-17 DIAGNOSIS — Z8522 Personal history of malignant neoplasm of nasal cavities, middle ear, and accessory sinuses: Secondary | ICD-10-CM | POA: Diagnosis not present

## 2017-08-17 DIAGNOSIS — Z85828 Personal history of other malignant neoplasm of skin: Secondary | ICD-10-CM | POA: Diagnosis not present

## 2017-08-17 DIAGNOSIS — F1414 Cocaine abuse with cocaine-induced mood disorder: Secondary | ICD-10-CM | POA: Diagnosis not present

## 2017-08-17 DIAGNOSIS — R441 Visual hallucinations: Secondary | ICD-10-CM | POA: Insufficient documentation

## 2017-08-17 DIAGNOSIS — Z79899 Other long term (current) drug therapy: Secondary | ICD-10-CM | POA: Insufficient documentation

## 2017-08-17 DIAGNOSIS — R442 Other hallucinations: Secondary | ICD-10-CM | POA: Insufficient documentation

## 2017-08-17 DIAGNOSIS — F332 Major depressive disorder, recurrent severe without psychotic features: Secondary | ICD-10-CM | POA: Diagnosis not present

## 2017-08-17 DIAGNOSIS — J45909 Unspecified asthma, uncomplicated: Secondary | ICD-10-CM | POA: Insufficient documentation

## 2017-08-17 DIAGNOSIS — Z87898 Personal history of other specified conditions: Secondary | ICD-10-CM

## 2017-08-17 DIAGNOSIS — F1991 Other psychoactive substance use, unspecified, in remission: Secondary | ICD-10-CM

## 2017-08-17 DIAGNOSIS — Z046 Encounter for general psychiatric examination, requested by authority: Secondary | ICD-10-CM | POA: Diagnosis present

## 2017-08-17 HISTORY — DX: Disorder of thyroid, unspecified: E07.9

## 2017-08-17 LAB — CBC
HCT: 35.9 % — ABNORMAL LOW (ref 36.0–46.0)
Hemoglobin: 12.2 g/dL (ref 12.0–15.0)
MCH: 31.1 pg (ref 26.0–34.0)
MCHC: 34 g/dL (ref 30.0–36.0)
MCV: 91.6 fL (ref 78.0–100.0)
Platelets: 188 K/uL (ref 150–400)
RBC: 3.92 MIL/uL (ref 3.87–5.11)
RDW: 14.4 % (ref 11.5–15.5)
WBC: 6.7 K/uL (ref 4.0–10.5)

## 2017-08-17 LAB — COMPREHENSIVE METABOLIC PANEL
ALBUMIN: 3.9 g/dL (ref 3.5–5.0)
ALK PHOS: 62 U/L (ref 38–126)
ALT: 63 U/L — ABNORMAL HIGH (ref 14–54)
AST: 79 U/L — AB (ref 15–41)
Anion gap: 7 (ref 5–15)
BILIRUBIN TOTAL: 0.2 mg/dL — AB (ref 0.3–1.2)
BUN: 13 mg/dL (ref 6–20)
CO2: 27 mmol/L (ref 22–32)
Calcium: 9.1 mg/dL (ref 8.9–10.3)
Chloride: 109 mmol/L (ref 101–111)
Creatinine, Ser: 0.87 mg/dL (ref 0.44–1.00)
GFR calc Af Amer: >60 mL/min (ref >60)
GFR calc non Af Amer: >60 mL/min (ref >60)
GLUCOSE: 97 mg/dL (ref 65–99)
POTASSIUM: 3.7 mmol/L (ref 3.5–5.1)
Sodium: 143 mmol/L (ref 135–145)
TOTAL PROTEIN: 6.9 g/dL (ref 6.5–8.1)

## 2017-08-17 LAB — ETHANOL: Alcohol, Ethyl (B): <10 mg/dL (ref <10)

## 2017-08-17 LAB — AMMONIA: Ammonia: 28 umol/L (ref 9–35)

## 2017-08-17 MED ORDER — NICOTINE 21 MG/24HR TD PT24: 21.0000 mg | MEDICATED_PATCH | Freq: Every day | TRANSDERMAL | Status: DC

## 2017-08-17 MED ORDER — ACETAMINOPHEN 325 MG PO TABS: 650.0000 mg | ORAL_TABLET | ORAL | Status: DC | PRN

## 2017-08-17 MED ORDER — ONDANSETRON HCL 4 MG PO TABS: 4.0000 mg | ORAL_TABLET | Freq: Three times a day (TID) | ORAL | Status: DC | PRN

## 2017-08-17 MED ORDER — ALUM & MAG HYDROXIDE-SIMETH 200-200-20 MG/5ML PO SUSP: 30.0000 mL | Freq: Four times a day (QID) | ORAL | Status: DC | PRN

## 2017-08-17 NOTE — Progress Notes (Signed)
Patient ID: Monica [name], female    DOB: 1956/05/24, 62 y.o.   MRN: [id number]  Chief Complaint  Patient presents with  . Follow-up    Allergies Patient has no allergy information on record.  Subjective:   Monica [name] is a 62 y.o. female who presents to [facility] today.  HPI [name] presents today with her sister, Monica [name], for an office visit.  She comes in today after a recent hospital admission at [facility] on 08/06/2017.  I have some of the patient information sheets from her hospital admission however I do not have a discharge summary.  Patient sister reports that she took her to the hospital because several days prior to the admission, she was having auditory and visual hallucinations.  She reports that she was having conversations with her deceased father and deceased relatives.  Per her sister's report, she stayed in her room, sat by herself, stared at the walls and would just continually talk to herself and carry on a conversation.  She took her to the hospital because she reports that she knew this was not normal behavior.  She was admitted to the hospital and evaluated by her previous primary care physician, Dr. Aretta [name].    Was admitted to hosptial. Had labs and head CT. Bring in some records from the hospital which did reveal urine positive for BZD and cocaine and amphetamines. Urine was positive for UTI. Was told that ammonia levels were elevated.  Was started on Seroquel, clonidine and subsequently discharged from the hospital.  I do have laboratory studies from the hospital and patient reportedly did have slightly elevated ammonia levels and evidence of a urinary tract infection.  She was also started on antibiotics.  She came home from the hospital and then  2 days later was hallucinating again and called Dr. Aretta [name] who had taken care of her in the hosptial at [facility]. He told her that he could not see her b/c he was not her PCP and then after multiple  calls to the nurse they eventually told her to change her clonidine patch. Reports that changed clonidine patch and patient calm down and eventually hallucinations subsided.  Patient is here in the office with her sister today.  She reports that she feels mentally unstable and very scared and fearful.  She is worried that she is going to have more hallucinations.  She reports that she occasionally may see or hear things that other people do not.  She does have a previous diagnosis of bipolar disorder.  She reports a long-term use of benzodiazepines.  She denies any suicidal ideations.  She reports that she is scared to be alone and also scared that she may use cocaine again.  She reports that she would like some treatment and would like to be admitted for treatment and stabilization of her mood.  Her sister is in agreement of this because she is very concerned about her.    Past Medical History:  Diagnosis Date  . Anxiety   . Asthma   . Hepatitis C   . Hypertension     Past Surgical History:  Procedure Laterality Date  . TUBAL LIGATION      Family History  Problem Relation Age of Onset  . Lung cancer Mother   . Heart disease Father   . Hypertension Sister   . Diabetes Maternal Grandmother   . Stroke Maternal Grandmother      Social History   Socioeconomic  History  . Marital status: Divorced    Spouse name: None  . Number of children: None  . Years of education: None  . Highest education level: None  Social Needs  . Financial resource strain: None  . Food insecurity - worry: None  . Food insecurity - inability: None  . Transportation needs - medical: None  . Transportation needs - non-medical: None  Occupational History  . None  Tobacco Use  . Smoking status: Current Every Day Smoker  . Smokeless tobacco: Never Used  Substance and Sexual Activity  . Alcohol use: Yes    Comment: occasional  . Drug use: No  . Sexual activity: None  Other Topics Concern  . None    Social History Narrative   Has two children. Divorced. Lives alone in [place]. Worked Architect. Smokes cigarettes. Eats all food groups.     Review of Systems  Constitutional: Negative for activity change, appetite change and fever.  HENT: Negative for congestion, dental problem, facial swelling, sore throat, trouble swallowing and voice change.   Eyes: Negative for visual disturbance.  Respiratory: Negative for cough, chest tightness and shortness of breath.   Cardiovascular: Negative for chest pain, palpitations and leg swelling.  Gastrointestinal: Negative for abdominal pain, nausea and vomiting.  Genitourinary: Negative for difficulty urinating, dysuria, frequency, hematuria and urgency.  Skin: Negative for rash.  Neurological: Negative for dizziness, syncope and light-headedness.  Hematological: Negative for adenopathy.  Psychiatric/Behavioral: Positive for agitation, behavioral problems, decreased concentration, dysphoric mood, hallucinations and sleep disturbance. Negative for self-injury and suicidal ideas. The patient is nervous/anxious and is hyperactive.      Objective:   BP 122/78 (BP Location: Left Arm, Patient Position: Sitting, Cuff Size: Normal)   Pulse 86   Temp 97.9 F (36.6 C) (Temporal)   Resp 16   Ht 5\' 1"  (1.549 m)   Wt 120 lb 12 oz (54.8 kg)   SpO2 98%   BMI 22.82 kg/m   Physical Exam  Constitutional: She is oriented to person, place, and time. She appears well-developed and well-nourished.  HENT:  Head: Normocephalic and atraumatic.  Eyes: No scleral icterus.  Neck: Normal range of motion. Neck supple.  Neurological: She is alert and oriented to person, place, and time. No cranial nerve deficit.  Skin: Skin is warm and dry.  Psychiatric: Her mood appears anxious. Her affect is labile. Her speech is rapid and/or pressured. She is agitated. She expresses no homicidal and no suicidal ideation. She expresses no suicidal plans and no homicidal plans.   Vitals reviewed.    Assessment and Plan  1. Mood disorder (HCC)  2. Hallucinations   3. History of illicit drug use Patient with history of mood disorder/psychiatric disorder and history of illegal recreational drug use.  She has a recent hospitalization for auditory and visual hallucinations which are most likely secondary to psychiatric disorder.  She has a history of noncompliance and keeping appointments.  I have set her up with behavioral health in the past and she has not kept her appointments.  In fact today she did have an appointment with behavioral health but reportedly did not remember and her sister was not aware.  They reportedly come to the office today seeking help.  She does wish to be admitted to the hospital for evaluation and stabilization of her mood.  She does seek assistance for substance abuse.  She denies any current substance abuse and none since she was discharged from the hospital.  I do believe that  her mood is not stable.  I believe that her insight and judgment are poor.  We called the behavioral health center.  They recommend the patient be sent to [facility] emergency department for evaluation by psychiatry.  Patient is to be seen at the emergency department today and evaluated by psychiatry for possible admission.  Patient was going to be taken to the emergency department by her sister.  She was stable for transport.  She was not an active threat to herself or anyone else.  She is headed there at this time.   No Follow-up on file. Caren Macadam, MD 08/17/2017

## 2017-08-17 NOTE — ED Provider Notes (Signed)
[facility] DEPT Provider Note   CSN: [id number] Arrival date & time: 08/17/17  5993     History   Chief Complaint Chief Complaint  Patient presents with  . Medical Clearance    HPI Monica [name] is a 62 y.o. female.  HPI   62 year old female with past medical history of asthma, hypertension, here with desire for detox.  The patient reports a chronic history of cocaine abuse.  She has recently stopped using cocaine 3 weeks ago.  Since then, she has been having visual hallucinations.  She states she has been seeing things at home.  She was recently admitted at an outside hospital for this and spent several nights in the hospital.  She was then sent home.  She said some visual hallucinations since then.  Denies any head trauma.  Denies any tremors.  Denies any auditory hallucinations.  She states these things seem to come and go.  No known history of schizophrenia or mood disorder.  However, she does reportedly have episodes where she sleeps less than usual and has increased risk-taking behavior.  She feels like she needs help because she feels like she can no longer live with her ongoing addiction and subsequently presents for evaluation.  She was sent here by her PCP.  She endorses struggling with her addiction but denies any overt suicidal ideation.  No homicidal ideation.  No current hallucinations.  Past Medical History:  Diagnosis Date  . Anxiety   . Asthma   . Hepatitis C   . Hypertension   . Thyroid disease     Patient Active Problem List   Diagnosis Date Noted  . 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 Restless legs 06/30/2017  . Basal cell carcinoma of skin of scalp and neck 06/30/2017  . Hypothyroidism, unspecified 06/30/2017  . Acute bronchitis, unspecified 06/30/2017    Past Surgical History:  Procedure Laterality Date  . TUBAL LIGATION      OB History    No data available       Home Medications    Prior to Admission  medications   Medication Sig Start Date End Date Taking? Authorizing Provider  ALPRAZolam Duanne Moron) 1 MG tablet Take 1 mg by mouth 3 (three) times daily as needed for anxiety.   Yes [provider]  busPIRone (BUSPAR) 30 MG tablet Take 30 mg by mouth 2 (two) times daily.   Yes [provider]  cloNIDine (CATAPRES - DOSED IN MG/24 HR) 0.2 mg/24hr patch Place 0.2 mg onto the skin once a week.   Yes [provider]  levothyroxine (SYNTHROID, LEVOTHROID) 75 MCG tablet Take 75 mcg by mouth daily before breakfast.   Yes [provider]  QUEtiapine (SEROQUEL) 400 MG tablet Take 200 mg by mouth at bedtime.    Yes [provider]    Family History Family History  Problem Relation Age of Onset  . Lung cancer Mother   . Heart disease Father   . Hypertension Sister   . Diabetes Maternal Grandmother   . Stroke Maternal Grandmother     Social History Social History   Tobacco Use  . Smoking status: Current Every Day Smoker  . Smokeless tobacco: Never Used  Substance Use Topics  . Alcohol use: Yes    Comment: occasional  . Drug use: Yes    Types: Cocaine    Comment: last used 3 weeks ago     Allergies   Patient has no known allergies.  Review of Systems Review of Systems  Constitutional: Negative for chills and fever.  HENT: Negative for congestion, rhinorrhea and sore throat.   Eyes: Negative for visual disturbance.  Respiratory: Negative for cough, shortness of breath and wheezing.   Cardiovascular: Negative for chest pain and leg swelling.  Gastrointestinal: Negative for abdominal pain, diarrhea, nausea and vomiting.  Genitourinary: Negative for dysuria, flank pain, vaginal bleeding and vaginal discharge.  Musculoskeletal: Negative for neck pain.  Skin: Negative for rash.  Allergic/Immunologic: Negative for immunocompromised state.  Neurological: Negative for syncope and headaches.  Hematological: Does not bruise/bleed easily.    Psychiatric/Behavioral: Positive for dysphoric mood and hallucinations.  All other systems reviewed and are negative.    Physical Exam Updated Vital Signs BP 138/74 (BP Location: Left Arm)   Pulse 98   Temp 97.9 F (36.6 C) (Oral)   Resp 18   SpO2 99%   Physical Exam  Constitutional: She is oriented to person, place, and time. She appears well-developed and well-nourished. No distress.  HENT:  Head: Normocephalic and atraumatic.  Eyes: Conjunctivae are normal.  Neck: Neck supple.  Cardiovascular: Normal rate, regular rhythm and normal heart sounds. Exam reveals no friction rub.  No murmur heard. Pulmonary/Chest: Effort normal and breath sounds normal. No respiratory distress. She has no wheezes. She has no rales.  Abdominal: She exhibits no distension.  Musculoskeletal: She exhibits no edema.  Neurological: She is alert and oriented to person, place, and time. She exhibits normal muscle tone.  Skin: Skin is warm. Capillary refill takes less than 2 seconds.  Psychiatric: She has a normal mood and affect.  Nursing note and vitals reviewed.    ED Treatments / Results  Labs (all labs ordered are listed, but only abnormal results are displayed) Labs Reviewed  COMPREHENSIVE METABOLIC PANEL - Abnormal; Notable for the following components:      Result Value   AST 79 (*)    ALT 63 (*)    Total Bilirubin 0.2 (*)    All other components within normal limits  CBC - Abnormal; Notable for the following components:   HCT 35.9 (*)    All other components within normal limits  ETHANOL  AMMONIA  RAPID URINE DRUG SCREEN, HOSP PERFORMED    EKG  EKG Interpretation None       Radiology Ct Head Wo Contrast  Result Date: 08/17/2017 CLINICAL DATA:  History of drug use.  Recent hallucinations. EXAM: CT HEAD WITHOUT CONTRAST TECHNIQUE: Contiguous axial images were obtained from the base of the skull through the vertex without intravenous contrast. COMPARISON:  08/06/2017 FINDINGS:  Brain: The brain shows a normal appearance without evidence of malformation, atrophy, old or acute small or large vessel infarction, mass lesion, hemorrhage, hydrocephalus or extra-axial collection. Vascular: Mild atherosclerotic calcification of the major vessels at the base of the brain. Skull: Normal.  No traumatic finding.  No focal bone lesion. Sinuses/Orbits: Sinuses are clear. Orbits appear normal. Mastoids are clear. Other: None significant IMPRESSION: Normal appearance of the brain itself. Mild atherosclerotic calcification of the major vessels at the base of the brain. Electronically Signed   By: Nelson Chimes M.D.   On: 08/17/2017 21:41    Procedures Procedures (including critical care time)  Medications Ordered in ED Medications  acetaminophen (TYLENOL) tablet 650 mg (not administered)  ondansetron (ZOFRAN) tablet 4 mg (not administered)  alum & mag hydroxide-simeth (MAALOX/MYLANTA) 200-200-20 MG/5ML suspension 30 mL (not administered)  nicotine (NICODERM CQ - dosed in mg/24 hours) patch 21 mg (  not administered)     Initial Impression / Assessment and Plan / ED Course  I have reviewed the triage vital signs and the nursing notes.  Pertinent labs & imaging results that were available during my care of the patient were reviewed by me and considered in my medical decision making (see chart for details).     62 year old female here with desire for detoxification from ongoing cocaine abuse.  She does have reported visual hallucinations.  This may be secondary to her chronic drug use, withdrawal, but also possibly underlying mood disorder.  She has no apparent acute emergent medical pathology.  She has chronically elevated LFTs.  Ammonia is normal.  She has known cirrhosis. CT head is negative..  Do not suspect acute complication related to her liver disease.  She is medically stable for psych disposition.  Final Clinical Impressions(s) / ED Diagnoses   Final diagnoses:  Visual  hallucinations  Polysubstance abuse [facility])    ED Discharge Orders    None       Duffy Bruce, MD 08/18/17 [phone]

## 2017-08-17 NOTE — ED Triage Notes (Signed)
Pt states she was sent here by her dr for medical clearance  Pt states she has used drugs for the past 20 years and is trying to stop  Pt states she last used crack cocaine 3 weeks ago  Pt states she started having hallucinations a week ago and went to her dr and was started on some medication  Pt states she needs some help and was sent here  Pt would like a nicotine patch

## 2017-08-18 ENCOUNTER — Telehealth: Payer: Self-pay

## 2017-08-18 DIAGNOSIS — F1414 Cocaine abuse with cocaine-induced mood disorder: Secondary | ICD-10-CM | POA: Diagnosis present

## 2017-08-18 LAB — RAPID URINE DRUG SCREEN, HOSP PERFORMED
Amphetamines: POSITIVE — AB
BARBITURATES: NOT DETECTED
Benzodiazepines: POSITIVE — AB
COCAINE: POSITIVE — AB
OPIATES: NOT DETECTED
Tetrahydrocannabinol: NOT DETECTED

## 2017-08-18 MED ORDER — LEVOTHYROXINE SODIUM 75 MCG PO TABS
75.0000 ug | ORAL_TABLET | Freq: Every day | ORAL | Status: DC
  Administered 2017-08-18: 75 ug via ORAL
  Filled 2017-08-18: qty 1

## 2017-08-18 MED ORDER — TRAZODONE HCL 50 MG PO TABS
50.0000 mg | ORAL_TABLET | Freq: Every evening | ORAL | Status: DC | PRN
  Administered 2017-08-18: 50 mg via ORAL
  Filled 2017-08-18: qty 1

## 2017-08-18 MED ORDER — QUETIAPINE FUMARATE 100 MG PO TABS
200.0000 mg | ORAL_TABLET | Freq: Every day | ORAL | Status: DC
  Administered 2017-08-18: 200 mg via ORAL
  Filled 2017-08-18: qty 2

## 2017-08-18 MED ORDER — TRAZODONE HCL 50 MG PO TABS: 50.0000 mg | ORAL_TABLET | Freq: Every evening | ORAL | Status: DC | PRN

## 2017-08-18 MED ORDER — BUSPIRONE HCL 10 MG PO TABS
30.0000 mg | ORAL_TABLET | Freq: Two times a day (BID) | ORAL | Status: DC
Start: 2017-08-18 — End: 2017-08-18
  Administered 2017-08-18 (×2): 30 mg via ORAL
  Filled 2017-08-18 (×2): qty 3

## 2017-08-18 NOTE — Patient Outreach (Signed)
CPSS met with the patient and provided substance use recovery support. Patient is interested in substance use treatment and finding a support group. CPSS provided many different types of resources and went into detail explaining each resource. Some of the resources included is recovery meeting list for [facility], a list substance use residential treatment centers in [place], information for [facility] in [facility], and list of outpatient treatment centers. CPSS also provided CPSS contact information. CPSS encouraged the patient to contact CPSS at anytime regarding help with substance use recovery resources or substance use recovery support.

## 2017-08-18 NOTE — BH Assessment (Signed)
[facility] Assessment Progress Note  Per Buford Dresser, DO, this pt does not require psychiatric hospitalization at this time.  Pt is to be discharged from [facility] with recommendation to follow up with [facility].  This has been included in pt's discharge instructions.  Pt would also benefit from seeing Peer Support Specialists; they will be asked to speak to pt.  Pt's nurse, Nena Jordan, has been notified.  Jalene Mullet, [place] Triage Specialist [phone]

## 2017-08-18 NOTE — Telephone Encounter (Signed)
Pam's sister called and said [facility] discharged her .  Today and yesterday Dr Mannie Stabile called [facility] and Bubba Hales called [facility] and requested the patient be admitted to [facility] rehab.  I am sending Ava in [facility] the message as well.  We do not know what to do at this point.    I emailed Ave because I could not get her on the phone - Ava,  You had worked with Olin Hauser Marvin DOB [birth year].  She went to [facility] ER yesterday under the direction of Dr Mannie Stabile.  Dr Mannie Stabile had asked to have her admitted to [facility] due to drug addiction.  She is staying with her sister and says she wants to get off the drugs.  [facility] discharged her.  Not sure what to do to help her now.  Her sister said she will do whatever she needs to do to help [facility].  Dr Mannie Stabile feels she needs to be admitted to rehab.    Can you please call Sandi her sister at [phone].

## 2017-08-18 NOTE — ED Notes (Signed)
Pt discharged home. Discharged instructions read to pt who verbalized understanding. All belongings returned to pt who signed for same. Denies SI/HI, is not delusional and not responding to internal stimuli. Escorted pt to the ED exit.    

## 2017-08-18 NOTE — ED Notes (Signed)
Patient sleeping after taking sleeping medication. Will obtain EKG when patient is woke.

## 2017-08-18 NOTE — Telephone Encounter (Signed)
FYI Just received e-mail form Ava she is calling the sister Carlyon Prows now.

## 2017-08-18 NOTE — BHH Suicide Risk Assessment (Signed)
Suicide Risk Assessment  Discharge Assessment   [facility] Discharge Suicide Risk Assessment   Principal Problem: Cocaine abuse with cocaine-induced mood disorder [facility]) Discharge Diagnoses:  Patient Active Problem List   Diagnosis Date Noted  . Cocaine abuse with cocaine-induced mood disorder [facility]) [F14.14] 08/18/2017    Priority: High  . Squamous cell carcinoma of nose [C44.321] 06/30/2017  . Restless legs [G25.81] 06/30/2017  . Basal cell carcinoma of skin of scalp and neck [C44.41] 06/30/2017  . Hypothyroidism, unspecified [E03.9] 06/30/2017  . Acute bronchitis, unspecified [J20.9] 06/30/2017    Total Time spent with patient: 45 minutes  Musculoskeletal: Strength & Muscle Tone: within normal limits Gait & Station: normal Patient leans: N/A  Psychiatric Specialty Exam:   Blood pressure 138/74, pulse 98, temperature 97.9 F (36.6 C), temperature source Oral, resp. rate 18, SpO2 99 %.There is no height or weight on file to calculate BMI.  General Appearance: Disheveled  Eye Contact::  Good  Speech:  Normal Rate409  Volume:  Normal  Mood:  Euthymic  Affect:  Congruent  Thought Process:  Coherent and Descriptions of Associations: Intact  Orientation:  Full (Time, Place, and Person)  Thought Content:  WDL and Logical  Suicidal Thoughts:  No  Homicidal Thoughts:  No  Memory:  Immediate;   Good Recent;   Good Remote;   Good  Judgement:  Fair  Insight:  Fair  Psychomotor Activity:  Normal  Concentration:  Good  Recall:  Good  Fund of Knowledge:Fair  Language: Good  Akathisia:  No  Handed:  Right  AIMS (if indicated):     Assets:  Leisure Time Physical Health Resilience Social Support  Sleep:     Cognition: WNL  ADL's:  Intact   Mental Status Per Nursing Assessment::   On Admission:   62 yo female who presented to the ED after using cocaine with some hallucinations.  20 year use of cocaine and is interested in getting some assistance to get clean.  Peer support consult  placed.  No suicidal/homicidal ideations, hallucinations, or withdrawal symptoms.  STable for discharge.  Demographic Factors:  NA  Loss Factors: NA  Historical Factors: NA  Risk Reduction Factors:   Sense of responsibility to family, Living with another person, especially a relative, Positive social support and Positive therapeutic relationship  Continued Clinical Symptoms:  None   Cognitive Features That Contribute To Risk:  None    Suicide Risk:  Minimal: No identifiable suicidal ideation.  Patients presenting with no risk factors but with morbid ruminations; may be classified as minimal risk based on the severity of the depressive symptoms    Plan Of Care/Follow-up recommendations:  Activity:  as tolerated Diet:  heart healthy diet  LORD, JAMISON, NP 08/18/2017, 11:21 AM

## 2017-08-18 NOTE — ED Notes (Signed)
Patient informed urine sample.
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
## 2017-08-18 NOTE — Discharge Instructions (Signed)
To help you maintain a sober lifestyle, a substance abuse treatment program may be beneficial to you.  Contact [facility] at your earliest opportunity to ask about enrolling in their program:       [place]      [place], [place] [address]      [phone]

## 2017-08-18 NOTE — ED Notes (Signed)
Monica [name] contacted and informed that patient requesting something for sleep.

## 2017-08-21 ENCOUNTER — Telehealth (HOSPITAL_COMMUNITY): Payer: Self-pay

## 2017-08-21 NOTE — Telephone Encounter (Signed)
Writer consulted with the [facility] Psychiatrist Dr. Modesta Messing.  Writer spoke with the patient and she informed me that she will be going to [facility] tomorrow at [place].  Patient denies SI/HI/Psychosis. Writer sent an in Sports coach to the PCP

## 2017-08-31 ENCOUNTER — Telehealth: Payer: Self-pay | Admitting: Family Medicine

## 2017-08-31 NOTE — Telephone Encounter (Signed)
follow up-per surgeon office PT needs a Scan of the lower pelvic region. She is not sure why with all the test that have been completed...[phone]

## 2017-08-31 NOTE — Telephone Encounter (Signed)
Please note that the pt states she is doing much better. Just wanted me to advise

## 2017-08-31 NOTE — Telephone Encounter (Signed)
Noted  

## 2017-09-06 ENCOUNTER — Encounter: Payer: Self-pay | Admitting: Gastroenterology

## 2017-09-06 ENCOUNTER — Telehealth: Payer: Self-pay | Admitting: Gastroenterology

## 2017-09-06 ENCOUNTER — Ambulatory Visit: Payer: Medicaid Other | Admitting: Gastroenterology

## 2017-09-06 NOTE — Telephone Encounter (Signed)
Patient was a no show and letter sent  °

## 2017-09-12 NOTE — Telephone Encounter (Signed)
Called patient regarding message below. No answer, unable to leave message.  

## 2017-10-04 ENCOUNTER — Telehealth: Payer: Self-pay | Admitting: Family Medicine

## 2017-10-04 MED ORDER — BUSPIRONE HCL 30 MG PO TABS: 30.0000 mg | ORAL_TABLET | Freq: Two times a day (BID) | ORAL | 0 refills | Status: DC

## 2017-10-04 MED ORDER — LEVOTHYROXINE SODIUM 75 MCG PO TABS: 75.0000 ug | ORAL_TABLET | Freq: Every day | ORAL | 0 refills | Status: DC

## 2017-10-04 MED ORDER — QUETIAPINE FUMARATE 400 MG PO TABS: 200.0000 mg | ORAL_TABLET | Freq: Every day | ORAL | 0 refills | Status: DC

## 2017-10-04 MED ORDER — CLONIDINE 0.2 MG/24HR TD PTWK: 0.2000 mg | MEDICATED_PATCH | TRANSDERMAL | 0 refills | Status: DC

## 2017-10-04 NOTE — Telephone Encounter (Signed)
Please call in the medications for the PT Buspirone, Clonidine, levothyroxine, Quetiapine, I have sch'd an appt on the 9th

## 2017-10-04 NOTE — Progress Notes (Signed)
Note to Ava 09/24/17 requesting sign off for 07/14/17 [facility]

## 2017-10-17 ENCOUNTER — Ambulatory Visit: Payer: Self-pay | Admitting: Family Medicine

## 2017-10-24 ENCOUNTER — Ambulatory Visit: Payer: Self-pay | Admitting: Family Medicine

## 2017-10-30 ENCOUNTER — Other Ambulatory Visit: Payer: Self-pay | Admitting: Family Medicine

## 2017-11-01 ENCOUNTER — Ambulatory Visit: Payer: Self-pay | Admitting: Family Medicine

## 2017-11-18 ENCOUNTER — Other Ambulatory Visit: Payer: Self-pay | Admitting: Family Medicine

## 2017-11-20 ENCOUNTER — Other Ambulatory Visit: Payer: Self-pay | Admitting: Family Medicine

## 2017-11-21 ENCOUNTER — Other Ambulatory Visit: Payer: Self-pay | Admitting: Family Medicine

## 2017-11-22 ENCOUNTER — Telehealth: Payer: Self-pay | Admitting: Family Medicine

## 2017-11-22 NOTE — Telephone Encounter (Signed)
Patient left voicemail with name & number, no details. I returned her call & she was unavailable, they lady that answered the phone stated she would pass along my message.

## 2017-11-28 ENCOUNTER — Other Ambulatory Visit: Payer: Self-pay | Admitting: Family Medicine

## 2017-11-29 ENCOUNTER — Other Ambulatory Visit: Payer: Self-pay | Admitting: Family Medicine

## 2017-12-14 ENCOUNTER — Encounter: Payer: Self-pay | Admitting: Family Medicine

## 2017-12-15 ENCOUNTER — Encounter: Payer: Self-pay | Admitting: Family Medicine

## 2017-12-21 ENCOUNTER — Other Ambulatory Visit: Payer: Self-pay | Admitting: Family Medicine

## 2017-12-27 ENCOUNTER — Telehealth: Payer: Self-pay | Admitting: Gastroenterology

## 2017-12-27 ENCOUNTER — Encounter: Payer: Self-pay | Admitting: Internal Medicine

## 2017-12-27 ENCOUNTER — Encounter

## 2017-12-27 ENCOUNTER — Ambulatory Visit: Payer: Medicaid Other | Admitting: Gastroenterology

## 2017-12-27 NOTE — Telephone Encounter (Signed)
PATIENT WAS A NO SHOW AND LETTER SENT  °

## 2018-01-30 ENCOUNTER — Other Ambulatory Visit: Payer: Self-pay | Admitting: Family Medicine

## 2018-02-12 ENCOUNTER — Telehealth: Payer: Self-pay | Admitting: Family Medicine

## 2018-02-12 NOTE — Telephone Encounter (Signed)
Pt LVM - needs nurse to call regarding a Referral to [facility]

## 2018-02-14 NOTE — Telephone Encounter (Signed)
Left message requesting  call back.

## 2018-02-15 NOTE — Telephone Encounter (Signed)
Spoke with patient and she states she has gallstones. She has had an MRI and ultrasound but still needs colonoscopy before she can have surgery to remove. She is requesting referral to gastro.

## 2018-02-16 ENCOUNTER — Telehealth: Payer: Self-pay | Admitting: Family Medicine

## 2018-02-16 NOTE — Telephone Encounter (Signed)
See the phone message. Gwen Her. Mannie Stabile, MD

## 2018-02-16 NOTE — Telephone Encounter (Signed)
PATIENT AWARE

## 2018-02-16 NOTE — Telephone Encounter (Signed)
Please advise patient that I do already done a referral.  The referral was placed this year.  She is also missed several appointments with gastroenterology.  Her last missed office visit was December 27, 2017.  She can call them to reschedule.  The number is [phone].

## 2018-02-28 ENCOUNTER — Telehealth: Payer: Self-pay | Admitting: Family Medicine

## 2018-02-28 ENCOUNTER — Other Ambulatory Visit: Payer: Self-pay

## 2018-02-28 ENCOUNTER — Other Ambulatory Visit (HOSPITAL_COMMUNITY)
Admission: RE | Admit: 2018-02-28 | Discharge: 2018-02-28 | Disposition: A | Discharge status: Home / Self Care | Payer: Medicaid Other | Source: Ambulatory Visit | Attending: Family Medicine | Admitting: Family Medicine

## 2018-02-28 ENCOUNTER — Other Ambulatory Visit: Payer: Self-pay | Admitting: Family Medicine

## 2018-02-28 ENCOUNTER — Encounter: Payer: Self-pay | Admitting: Family Medicine

## 2018-02-28 ENCOUNTER — Ambulatory Visit (INDEPENDENT_AMBULATORY_CARE_PROVIDER_SITE_OTHER): Payer: Medicaid Other | Admitting: Family Medicine

## 2018-02-28 VITALS — BP 140/72 | HR 97 | Temp 98.9°F | Resp 12 | Ht 61.0 in | Wt 127.0 lb

## 2018-02-28 DIAGNOSIS — Z23 Encounter for immunization: Secondary | ICD-10-CM | POA: Diagnosis not present

## 2018-02-28 DIAGNOSIS — Z720 Tobacco use: Secondary | ICD-10-CM

## 2018-02-28 DIAGNOSIS — R350 Frequency of micturition: Secondary | ICD-10-CM | POA: Diagnosis not present

## 2018-02-28 DIAGNOSIS — B182 Chronic viral hepatitis C: Secondary | ICD-10-CM | POA: Diagnosis not present

## 2018-02-28 DIAGNOSIS — Z113 Encounter for screening for infections with a predominantly sexual mode of transmission: Secondary | ICD-10-CM

## 2018-02-28 DIAGNOSIS — K5900 Constipation, unspecified: Secondary | ICD-10-CM

## 2018-02-28 DIAGNOSIS — M62830 Muscle spasm of back: Secondary | ICD-10-CM | POA: Diagnosis not present

## 2018-02-28 DIAGNOSIS — E039 Hypothyroidism, unspecified: Secondary | ICD-10-CM

## 2018-02-28 LAB — POCT URINALYSIS DIPSTICK
Bilirubin, UA: NEGATIVE
Blood, UA: NEGATIVE
Glucose, UA: NEGATIVE
Ketones, UA: NEGATIVE
Nitrite, UA: NEGATIVE
Protein, UA: NEGATIVE
Spec Grav, UA: 1.025 (ref 1.010–1.025)
Urobilinogen, UA: 0.2 E.U./dL
pH, UA: 6 (ref 5.0–8.0)

## 2018-02-28 NOTE — Telephone Encounter (Signed)
New Message  Pt verbalized she is needing to speak with a nurse.  Please f/u

## 2018-02-28 NOTE — Telephone Encounter (Signed)
Attempted to return patient's call. Mobile number no in service. Home number busy. Will try again later.

## 2018-02-28 NOTE — Progress Notes (Signed)
Patient ID: Monica [name], female    DOB: 1955-10-16, 62 y.o.   MRN: [id number]  Chief Complaint  Patient presents with  . Medication Management    follow up  . Back Pain  . Leg Swelling  . leg cramps    at night    Allergies Patient has no known allergies.  Subjective:   Monica [name] is a 62 y.o. female who presents to [facility] today.  HPI Monica [name] presents here for follow up.  She has not been seen in our office in quite some time and is also missed appointments with gastroenterology.  She reports that she is doing well and is off of all drugs.  She reports she has been going to [facility] and is getting all of her medications from them.  She reports she is trying to get disability for her mood. Reports that was told that she is bipolar.  She reports that she was able to get off the drugs and has her own house.  She reports that she tries to stay away from people that can tend to lead her stray.  She reports that she did recently smoke some marijuana but she is not doing any drugs.   Reports that she believes that she is having an issue with her bladder and kidneys.  Reports she would like to get her urine checked because she might have a urinary tract infection.  She reports that for the past 5 to 6 months she has been urinating several times during the night and also frequently during the day.  She denies any dysuria.  She reports that her urine does have a funny odor.  She denies any gross hematuria.  She denies any vaginal discharge.  She reports she is not sexually active but would like to be checked for any sexually transmitted infections.  She denies any vaginal discharge.  She reports she does occasionally get some nausea.  She has not had any vomiting.  She is not having any belly pain at this time.  She reports she does have a follow-up with gastroenterology in regards to her hepatitis C and her bowel movements.  She reports that she has had issues with  constipation and diarrhea for years.  She believes this is due to her gallbladder which needs to be taken out per her report.  She reports she is not sure she wants to go through surgery until she has completed treatment for her hepatitis C.  She reports that her clonidine patch was stopped several days ago by her psychiatrist.  She reports she was not on the clonidine patch for blood pressure but was on it for her mood.  She does report she has been taking her thyroid medication on a daily basis.  She reports she has not missed her dose.  She has gained a significant amount of weight since she was here last but reports this is because she is eating and not doing drugs. She denies any fevers, nausea, vomiting, skin rashes.  She reports her appetite is good.  Her energy is good.  She denies any suicidal or homicidal ideation.  She would like to get a pneumonia vaccine today.  She reports she has had pneumonia several times. She does report that she occasionally gets some pain in the muscles of her low back and wants to make sure that it is not her kidneys.  She has never had any kidney stones.  She does report  that she gets some muscle spasms in her lower back from time to time.  She reports her muscles can get achy.  She reports she does sit a lot at the time.  She reports she does not have any weakness in her extremities.  She is urinating normally.  Does not have urinary hesitancy.  No numbness and tingling in her extremities.   Past Medical History:  Diagnosis Date  . Anxiety   . Asthma   . Hepatitis C   . Hypertension   . Thyroid disease     Past Surgical History:  Procedure Laterality Date  . TUBAL LIGATION      Family History  Problem Relation Age of Onset  . Lung cancer Mother   . Heart disease Father   . Hypertension Sister   . Diabetes Maternal Grandmother   . Stroke Maternal Grandmother      Social History   Socioeconomic History  . Marital status: Divorced    Spouse  name: Not on file  . Number of children: Not on file  . Years of education: Not on file  . Highest education level: Not on file  Occupational History  . Not on file  Social Needs  . Financial resource strain: Not on file  . Food insecurity:    Worry: Not on file    Inability: Not on file  . Transportation needs:    Medical: Not on file    Non-medical: Not on file  Tobacco Use  . Smoking status: Current Every Day Smoker  . Smokeless tobacco: Never Used  Substance and Sexual Activity  . Alcohol use: Yes    Comment: occasional  . Drug use: Yes    Types: Cocaine    Comment: last used 3 weeks ago  . Sexual activity: Not on file  Lifestyle  . Physical activity:    Days per week: Not on file    Minutes per session: Not on file  . Stress: Not on file  Relationships  . Social connections:    Talks on phone: Not on file    Gets together: Not on file    Attends religious service: Not on file    Active member of club or organization: Not on file    Attends meetings of clubs or organizations: Not on file    Relationship status: Not on file  Other Topics Concern  . Not on file  Social History Narrative   Has two children. Divorced. Lives alone in [place]. Worked Architect. Smokes cigarettes. Eats all food groups.     Review of Systems  Constitutional: Negative for activity change, appetite change, chills, diaphoresis, fever and unexpected weight change.  HENT: Negative for trouble swallowing and voice change.   Eyes: Negative for visual disturbance.  Respiratory: Negative for cough, chest tightness, shortness of breath and wheezing.   Cardiovascular: Negative for chest pain, palpitations and leg swelling.  Gastrointestinal: Positive for nausea. Negative for abdominal distention, abdominal pain, blood in stool and vomiting.  Endocrine: Negative for cold intolerance, heat intolerance, polydipsia and polyphagia.  Genitourinary: Positive for frequency. Negative for decreased urine  volume, dysuria, enuresis, flank pain, genital sores, hematuria, pelvic pain, urgency, vaginal bleeding, vaginal discharge and vaginal pain.  Neurological: Negative for dizziness, tremors, syncope, facial asymmetry, weakness, light-headedness, numbness and headaches.  Hematological: Negative for adenopathy.  Psychiatric/Behavioral: Negative for suicidal ideas.   Current Outpatient Medications on File Prior to Visit  Medication Sig Dispense Refill  . busPIRone (BUSPAR) 30 MG tablet TAKE  ONE TABLET BY MOUTH TWICE DAILY. 60 tablet 0  . citalopram (CELEXA) 20 MG tablet Take 20 mg by mouth daily.  2  . levothyroxine (SYNTHROID, LEVOTHROID) 75 MCG tablet TAKE ONE TABLET BY MOUTH DAILY BEFORE BREAKFAST. 30 tablet 0  . QUEtiapine (SEROQUEL) 400 MG tablet TAKE 1/2 TABLET BY MOUTH AT BEDTIME. 15 tablet 0  . traZODone (DESYREL) 150 MG tablet Take 150 mg by mouth daily.  2   No current facility-administered medications on file prior to visit.      Objective:   BP 140/72 (BP Location: Left Arm, Patient Position: Sitting, Cuff Size: Normal)   Pulse 97   Temp 98.9 F (37.2 C) (Temporal)   Resp 12   Ht 5\' 1"  (1.549 m)   Wt 127 lb 0.6 oz (57.6 kg)   SpO2 96% Comment: room air  BMI 24.00 kg/m   Physical Exam  Constitutional: She is oriented to person, place, and time. She appears well-developed and well-nourished. No distress.  HENT:  Head: Normocephalic and atraumatic.  Mouth/Throat: Oropharynx is clear and moist.  Eyes: Pupils are equal, round, and reactive to light. Conjunctivae and EOM are normal.  Neck: Normal range of motion. Neck supple. No JVD present. No tracheal deviation present. No thyromegaly present.  Cardiovascular: Normal rate, regular rhythm and intact distal pulses.  Pulmonary/Chest: Effort normal and breath sounds normal. No respiratory distress. She has no wheezes.  Abdominal: Soft. Bowel sounds are normal. She exhibits no distension. There is no tenderness.    Musculoskeletal: Normal range of motion. She exhibits no edema.  No CVA tenderness to palpation.  Strength 5 out of 5 in upper and lower extremities.  Grip strength strong bilaterally.  Neuro vasculature intact in upper extremities.  Lymphadenopathy:    She has no cervical adenopathy.  Neurological: She is alert and oriented to person, place, and time. She displays normal reflexes. No cranial nerve deficit. She exhibits normal muscle tone. Coordination normal.  Skin: Skin is warm and dry. Capillary refill takes less than 2 seconds. No rash noted. She is not diaphoretic.  Psychiatric: She has a normal mood and affect. Her behavior is normal.   Depression screen [facility] 2/9 02/28/2018 07/14/2017 07/14/2017 06/13/2017  Decreased Interest 2 3 0 0  Down, Depressed, Hopeless 2 3 0 0  PHQ - 2 Score 4 6 0 0  Altered sleeping 3 2 - -  Tired, decreased energy 3 2 - -  Change in appetite 3 0 - -  Feeling bad or failure about yourself  0 3 - -  Trouble concentrating 0 2 - -  Moving slowly or fidgety/restless 2 1 - -  Suicidal thoughts 0 1 - -  PHQ-9 Score 15 17 - -  Difficult doing work/chores Somewhat difficult - - -    Assessment and Plan  1. Hypothyroidism, unspecified type Check labs.  She has gained a good bit of weight but this is most likely secondary to the fact that she is now eating and not using cocaine.  Continue medications check levels. - TSH  2. Urinary frequency Patient reports 6 months of urinary frequency.  Will check her for any infection today point-of-care urinalysis was within normal limits.  Will send for urine culture.  We will also check electrolytes to make sure that her blood sugar is within normal limits. - POCT urinalysis dipstick - Urine Culture  3. Constipation, unspecified constipation type Chronic constipation and alternating diarrhea.  Will check electrolytes.  She will keep her scheduled  follow-up with GI for this and her hepatitis C. - COMPLETE METABOLIC PANEL WITH  GFR  4. Muscle spasm of back Chronic muscle spasms of the back.  Exercise and hydration recommended.  Discussed with patient I do not believe she is a good candidate for pain medication or muscle relaxers.  Exercise and core strengthening recommended.  5. Screen for STD (sexually transmitted disease) Screen for any sexually transmitted infections.  Patient defers hepatitis testing. - Urine cytology ancillary only - HIV antibody - RPR  6. Tobacco abuse The 5 A's Model for treating Tobacco Use and Dependence was used today. I have identified and documented tobacco use status for this patient. I have urged the patient to quit tobacco use. At this time, the patient is unwilling and not ready to attempt to quit. I have provided patient with information regarding risks, cessation techniques, and interventions that might increase future attempts to quit smoking. I will plan on again addressing tobacco dependence at the next visit.   7. Chronic hepatitis C without hepatic coma ([place]) Vaccinations given today.  Follow-up with GI.  She does already have a scheduled visit. - Tdap vaccine greater than or equal to 7yo IM - Pneumococcal polysaccharide vaccine 23-valent greater than or equal to 2yo subcutaneous/IM  No follow-ups on file.  She will follow-up with her new PCP within the next 2 to 4 weeks to establish care.  She was counseled to get her blood work today.  She will call with any questions or concerns.  Will await her urine results and pending those results we will determine what lab work is necessary.  Office visit was greater than 25 minutes.  Patient's questions and concerns were discussed.  It was advised that she follow-up and be compliant with her medications. Caren Macadam, MD 02/28/2018

## 2018-03-01 LAB — COMPLETE METABOLIC PANEL WITH GFR
AG RATIO: 1.5 (calc) (ref 1.0–2.5)
ALT: 54 U/L — ABNORMAL HIGH (ref 6–29)
AST: 51 U/L — AB (ref 10–35)
Albumin: 4.3 g/dL (ref 3.6–5.1)
Alkaline phosphatase (APISO): 72 U/L (ref 33–130)
BUN: 13 mg/dL (ref 7–25)
CO2: 25 mmol/L (ref 20–32)
Calcium: 9.3 mg/dL (ref 8.6–10.4)
Chloride: 108 mmol/L (ref 98–110)
Creat: 0.84 mg/dL (ref 0.50–0.99)
GFR, Est African American: 88 mL/min/{1.73_m2} (ref >=60)
GFR, Est Non African American: 76 mL/min/{1.73_m2} (ref >=60)
GLOBULIN: 2.8 g/dL (ref 1.9–3.7)
Glucose, Bld: 107 mg/dL (ref 65–139)
Potassium: 4 mmol/L (ref 3.5–5.3)
SODIUM: 143 mmol/L (ref 135–146)
Total Bilirubin: 0.4 mg/dL (ref 0.2–1.2)
Total Protein: 7.1 g/dL (ref 6.1–8.1)

## 2018-03-01 LAB — URINE CYTOLOGY ANCILLARY ONLY
CHLAMYDIA, DNA PROBE: NEGATIVE
Neisseria Gonorrhea: NEGATIVE
Trichomonas: NEGATIVE

## 2018-03-01 LAB — URINE CULTURE
MICRO NUMBER: 90998151
SPECIMEN QUALITY: ADEQUATE

## 2018-03-01 LAB — RPR: RPR: NONREACTIVE

## 2018-03-01 LAB — HIV ANTIBODY (ROUTINE TESTING W REFLEX): HIV: NONREACTIVE

## 2018-03-01 LAB — TSH: TSH: 3.71 mIU/L (ref 0.40–4.50)

## 2018-03-01 NOTE — Telephone Encounter (Signed)
Attempted to call patient. Main number not in service. Home number busy. Will try again later.

## 2018-03-02 ENCOUNTER — Encounter: Payer: Self-pay | Admitting: Family Medicine

## 2018-03-05 ENCOUNTER — Telehealth: Payer: Self-pay | Admitting: Family Medicine

## 2018-03-05 NOTE — Telephone Encounter (Signed)
Pt is calling to see if Labs are back in please call her at [phone]

## 2018-03-06 ENCOUNTER — Telehealth: Payer: Self-pay | Admitting: Family Medicine

## 2018-03-06 NOTE — Telephone Encounter (Signed)
Patient was sent a letter in the mail.  Please advise her of the results of the lab letter

## 2018-03-06 NOTE — Telephone Encounter (Signed)
Pt calling back wanting to know results,  Put pt on hold to get nurse, and pt hung up

## 2018-03-06 NOTE — Telephone Encounter (Signed)
Please advise on patients lab results.

## 2018-03-06 NOTE — Telephone Encounter (Signed)
Spoke with patient and advised her of what letter said. She is requesting either the Gabapentin be refilled or a prescription for a muscle relaxer. I told her I would have to sent a message to Dr.Hagler that I just couldn't give her those. She verbalized understanding.

## 2018-03-07 NOTE — Telephone Encounter (Signed)
Advise patient that I am happy to refer [name] to orthopedics for evaluation of [name] back and muscle spasms, but as I told [name] at the visit, I do not want to start [name] on muscle relaxers or gabapentin without finding out what is going on and causing [name] symptoms. I do not believe that chronic pain medications and or muscle relaxants are the best answer to this issue. Monica [name]. Mannie Stabile, MD

## 2018-03-08 ENCOUNTER — Encounter: Payer: Self-pay | Admitting: *Deleted

## 2018-03-08 ENCOUNTER — Other Ambulatory Visit: Payer: Self-pay | Admitting: *Deleted

## 2018-03-08 ENCOUNTER — Ambulatory Visit: Payer: Medicaid Other | Admitting: Nurse Practitioner

## 2018-03-08 ENCOUNTER — Telehealth: Payer: Self-pay | Admitting: *Deleted

## 2018-03-08 ENCOUNTER — Encounter: Payer: Self-pay | Admitting: Nurse Practitioner

## 2018-03-08 DIAGNOSIS — K5900 Constipation, unspecified: Secondary | ICD-10-CM

## 2018-03-08 DIAGNOSIS — B182 Chronic viral hepatitis C: Secondary | ICD-10-CM | POA: Diagnosis not present

## 2018-03-08 DIAGNOSIS — K625 Hemorrhage of anus and rectum: Secondary | ICD-10-CM

## 2018-03-08 MED ORDER — CLENPIQ 10-3.5-12 MG-GM -GM/160ML PO SOLN: 1.0000 | Freq: Once | ORAL | 0 refills | Status: AC

## 2018-03-08 NOTE — Telephone Encounter (Signed)
PRE-OP scheduled for 03/20/18 at 1:15pm. Letter mailed. LMOVM.

## 2018-03-08 NOTE — Progress Notes (Signed)
Primary Care Physician:  Caren Macadam, MD Primary Gastroenterologist:  Dr. Gala Romney  Chief Complaint  Patient presents with  . Diarrhea    every 3 days  . Hepatitis C  . Abdominal Pain    upper abd    HPI:   Monica [name] is a 62 y.o. female who presents on referral from primary care for diarrhea and hepatitis C positive.  Reviewed office visit note from primary care, most recent note dated 02/28/2018.  At that time she noted she was off all drugs and had been going to [facility].  During that visit the patient noted issues with constipation and diarrhea for a number of years.  She feels this is due to her gallbladder which needs to be taken out.  Noted history of positive otitis C antibody.  His labs find hepatitis C RNA at over 17 million copies.  Hepatitis panel negative for hepatitis A and B.  HIV negative.  She does have hypothyroidism in December TSH was 6.43.  Most recent TSH improved to 3.71.  Hepatic function panel has shown mild elevation of AST/ALT at 64/57, as would be expected with positive hepatitis C.  Today she states doing ok overall.; Has had issues with constipation and diarrhea for years. Has been told she has gallstones and needs her gallbladder out. U/S from [facility] with gallstones but no cholecystitis in 2018. Was on drugs but now is not. Has some abdominal discomfort when she bends forward and improves when she stands back up. Has increased weight lately. She will go 2-3 days without a bowel movement and then had a day where she has diarrhea all day; first bowel movement with a hard stool and then subsequent frequent diarrhea for the day. When she needs to have a bowel movement on 'that day" will get nauseated and sometimes vomit. Toilet tissue hematochezia with every bowel movement. Denies melena. Has never had a colonoscopy. Denies fever, chills, unintentional weight loss. Denies chest pain, dyspnea, dizziness, lightheadedness, syncope, near syncope. Denies any  other upper or lower GI symptoms.  Hepatitis C Risk Factors:  Birth cohort ([place]): Yes IV drug use: Yes Tattoos: No Blood product transfusion: No HC worker: No Hemodialysis: No Maternal infection: No  Past Medical History:  Diagnosis Date  . Anxiety   . Asthma   . Hepatitis C   . History of drug abuse   . Hypertension   . Thyroid disease     Past Surgical History:  Procedure Laterality Date  . TUBAL LIGATION      Current Outpatient Medications  Medication Sig Dispense Refill  . busPIRone (BUSPAR) 30 MG tablet TAKE ONE TABLET BY MOUTH TWICE DAILY. 60 tablet 3  . CATAPRES-TTS-2 0.2 MG/24HR patch APPLY ONE PATCH ONTO THE SKIN ONCE A WEEK. 4 patch 0  . citalopram (CELEXA) 20 MG tablet Take 20 mg by mouth daily.  2  . levothyroxine (SYNTHROID, LEVOTHROID) 75 MCG tablet TAKE ONE TABLET BY MOUTH DAILY BEFORE BREAKFAST. 30 tablet 3  . nicotine (NICODERM CQ - DOSED IN MG/24 HOURS) 14 mg/24hr patch Place 14 mg onto the skin daily.    . QUEtiapine (SEROQUEL) 400 MG tablet TAKE 1/2 TABLET BY MOUTH AT BEDTIME. 15 tablet 3  . traZODone (DESYREL) 150 MG tablet Take 150 mg by mouth daily.  2   No current facility-administered medications for this visit.     Allergies as of 03/08/2018  . (No Known Allergies)    Family History  Problem  Relation Age of Onset  . Lung cancer Mother   . Heart disease Father   . Hypertension Sister   . Diabetes Maternal Grandmother   . Stroke Maternal Grandmother   . Colon cancer Neg Hx     Social History   Socioeconomic History  . Marital status: Divorced    Spouse name: Not on file  . Number of children: Not on file  . Years of education: Not on file  . Highest education level: Not on file  Occupational History  . Not on file  Social Needs  . Financial resource strain: Not on file  . Food insecurity:    Worry: Not on file    Inability: Not on file  . Transportation needs:    Medical: Not on file    Non-medical: Not on file    Tobacco Use  . Smoking status: Current Every Day Smoker    Packs/day: 1.00    Years: 45.00    Pack years: 45.00    Types: Cigarettes  . Smokeless tobacco: Never Used  Substance and Sexual Activity  . Alcohol use: Not Currently  . Drug use: Not Currently    Types: Cocaine, Marijuana, Methamphetamines    Comment: last used 2 months ago 03/08/18; history of polysubstance abuse x 15 years  . Sexual activity: Not on file  Lifestyle  . Physical activity:    Days per week: Not on file    Minutes per session: Not on file  . Stress: Not on file  Relationships  . Social connections:    Talks on phone: Not on file    Gets together: Not on file    Attends religious service: Not on file    Active member of club or organization: Not on file    Attends meetings of clubs or organizations: Not on file    Relationship status: Not on file  . Intimate partner violence:    Fear of current or ex partner: Not on file    Emotionally abused: Not on file    Physically abused: Not on file    Forced sexual activity: Not on file  Other Topics Concern  . Not on file  Social History Narrative   Has two children. Divorced. 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 Eats all food groups.     Review of Systems: General: Negative for anorexia, weight loss, fever, chills, fatigue, weakness. ENT: Negative for hoarseness, difficulty swallowing , nasal congestion. CV: Negative for chest pain, angina, palpitations, dyspnea on exertion, peripheral edema.  Respiratory: Negative for dyspnea at rest, dyspnea on exertion, cough, sputum, wheezing.  GI: See history of present illness. Derm: Negative for rash or itching.  Neuro: Negative for memory loss, confusion.  Psych: Negative for anxiety, depression, suicidal ideation, hallucinations.  Endo: Negative for unusual weight change.  Heme: Negative for bruising or bleeding. Allergy: Negative for rash or hives.    Physical Exam: BP 130/68    Pulse 95   Temp (!) 97.5 F (36.4 C) (Oral)   Ht 5\' 1"  (1.549 m)   Wt 129 lb 12.8 oz (58.9 kg)   BMI 24.53 kg/m  General:   Alert and oriented. Pleasant and cooperative. Well-nourished and well-developed.  Eyes:  Without icterus, sclera clear and conjunctiva pink.  Ears:  Normal auditory acuity. Cardiovascular:  S1, S2 present without murmurs appreciated. Extremities without clubbing or edema. Respiratory:  Clear to auscultation bilaterally. No wheezes, rales, or rhonchi. No distress.  Gastrointestinal:  +BS, soft, and non-distended.  Mild generalized TTP. No HSM noted. No guarding or rebound. No masses appreciated.  Rectal:  Deferred  Musculoskalatal:  Symmetrical without gross deformities. Neurologic:  Alert and oriented x4;  grossly normal neurologically. Psych:  Alert and cooperative. Normal mood and affect. Heme/Lymph/Immune: No excessive bruising noted.    03/08/2018 9:34 AM   Disclaimer: This note was dictated with voice recognition software. Similar sounding words can inadvertently be transcribed and may not be corrected upon review.

## 2018-03-08 NOTE — Assessment & Plan Note (Signed)
The patient describes a stool pattern wherein she will be constipated with no bowel movement for 3 days and then typically on the fourth day she will have a hard stool followed by a day long progression of diarrhea.  The cycle then restarts itself.  She does have some abdominal discomfort when bending forward and which improves with standing up which I feel is likely due to constipation and stool burden with crowding while bending forward.  At this point I will trial her on Linzess 72 mcg daily.  I will provide samples and request a progress report in 2 weeks.  Follow-up in 3 months.

## 2018-03-08 NOTE — Assessment & Plan Note (Signed)
The patient describes rectal bleeding as scant toilet tissue hematochezia which occurs with every bowel movement.  This is in the setting of constipation with a hard stool followed by copious overflow diarrhea.  She is never had a colonoscopy and is 62 years old.  She is currently due.  We will set her up for colonoscopy to further evaluate.  Return for follow-up in 3 months.  Proceed with TCS on propofol/MAC with Dr. Gala Romney in near future: the risks, benefits, and alternatives have been discussed with the patient in detail. The patient states understanding and desires to proceed.  The patiently is currently on BuSpar, Celexa, Seroquel, trazodone.  She has a 15-year history of drug abuse and has been clean for 2 to 3 months at this time.  She understands the need for urine drug screen and is totally agreeable to this.  Due to her histories we will plan for the procedure on propofol/MAC to promote adequate sedation.

## 2018-03-08 NOTE — Progress Notes (Signed)
CC'D TO PCP °

## 2018-03-08 NOTE — Assessment & Plan Note (Signed)
The patient tested positive for hepatitis C.  Hepatitis B and HIV negative.  Mild elevation of transaminases.  Likely contracted the virus from drug abuse, although she is in the birth cohort which also makes her high risk.  At this point we will check labs including CBC, CMP, hepatitis C genotype.  We will also check a right upper quadrant ultrasound for degree of fibrosis/cirrhosis, if any.  Based on all of these we can begin planning hepatitis C treatment options working with her insurance company.  Return for follow-up in 3 months.

## 2018-03-08 NOTE — H&P (View-Only) (Signed)
Primary Care Physician:  Caren Macadam, MD Primary Gastroenterologist:  Dr. Gala Romney  Chief Complaint  Patient presents with  . Diarrhea    every 3 days  . Hepatitis C  . Abdominal Pain    upper abd    HPI:   Monica [name] is a 62 y.o. female who presents on referral from primary care for diarrhea and hepatitis C positive.  Reviewed office visit note from primary care, most recent note dated 02/28/2018.  At that time she noted she was off all drugs and had been going to [facility].  During that visit the patient noted issues with constipation and diarrhea for a number of years.  She feels this is due to her gallbladder which needs to be taken out.  Noted history of positive otitis C antibody.  His labs find hepatitis C RNA at over 17 million copies.  Hepatitis panel negative for hepatitis A and B.  HIV negative.  She does have hypothyroidism in December TSH was 6.43.  Most recent TSH improved to 3.71.  Hepatic function panel has shown mild elevation of AST/ALT at 64/57, as would be expected with positive hepatitis C.  Today she states doing ok overall.; Has had issues with constipation and diarrhea for years. Has been told she has gallstones and needs her gallbladder out. U/S from [facility] with gallstones but no cholecystitis in 2018. Was on drugs but now is not. Has some abdominal discomfort when she bends forward and improves when she stands back up. Has increased weight lately. She will go 2-3 days without a bowel movement and then had a day where she has diarrhea all day; first bowel movement with a hard stool and then subsequent frequent diarrhea for the day. When she needs to have a bowel movement on 'that day" will get nauseated and sometimes vomit. Toilet tissue hematochezia with every bowel movement. Denies melena. Has never had a colonoscopy. Denies fever, chills, unintentional weight loss. Denies chest pain, dyspnea, dizziness, lightheadedness, syncope, near syncope. Denies any  other upper or lower GI symptoms.  Hepatitis C Risk Factors:  Birth cohort ([place]): Yes IV drug use: Yes Tattoos: No Blood product transfusion: No HC worker: No Hemodialysis: No Maternal infection: No  Past Medical History:  Diagnosis Date  . Anxiety   . Asthma   . Hepatitis C   . History of drug abuse   . Hypertension   . Thyroid disease     Past Surgical History:  Procedure Laterality Date  . TUBAL LIGATION      Current Outpatient Medications  Medication Sig Dispense Refill  . busPIRone (BUSPAR) 30 MG tablet TAKE ONE TABLET BY MOUTH TWICE DAILY. 60 tablet 3  . CATAPRES-TTS-2 0.2 MG/24HR patch APPLY ONE PATCH ONTO THE SKIN ONCE A WEEK. 4 patch 0  . citalopram (CELEXA) 20 MG tablet Take 20 mg by mouth daily.  2  . levothyroxine (SYNTHROID, LEVOTHROID) 75 MCG tablet TAKE ONE TABLET BY MOUTH DAILY BEFORE BREAKFAST. 30 tablet 3  . nicotine (NICODERM CQ - DOSED IN MG/24 HOURS) 14 mg/24hr patch Place 14 mg onto the skin daily.    . QUEtiapine (SEROQUEL) 400 MG tablet TAKE 1/2 TABLET BY MOUTH AT BEDTIME. 15 tablet 3  . traZODone (DESYREL) 150 MG tablet Take 150 mg by mouth daily.  2   No current facility-administered medications for this visit.     Allergies as of 03/08/2018  . (No Known Allergies)    Family History  Problem  Relation Age of Onset  . Lung cancer Mother   . Heart disease Father   . Hypertension Sister   . Diabetes Maternal Grandmother   . Stroke Maternal Grandmother   . Colon cancer Neg Hx     Social History   Socioeconomic History  . Marital status: Divorced    Spouse name: Not on file  . Number of children: Not on file  . Years of education: Not on file  . Highest education level: Not on file  Occupational History  . Not on file  Social Needs  . Financial resource strain: Not on file  . Food insecurity:    Worry: Not on file    Inability: Not on file  . Transportation needs:    Medical: Not on file    Non-medical: Not on file    Tobacco Use  . Smoking status: Current Every Day Smoker    Packs/day: 1.00    Years: 45.00    Pack years: 45.00    Types: Cigarettes  . Smokeless tobacco: Never Used  Substance and Sexual Activity  . Alcohol use: Not Currently  . Drug use: Not Currently    Types: Cocaine, Marijuana, Methamphetamines    Comment: last used 2 months ago 03/08/18; history of polysubstance abuse x 15 years  . Sexual activity: Not on file  Lifestyle  . Physical activity:    Days per week: Not on file    Minutes per session: Not on file  . Stress: Not on file  Relationships  . Social connections:    Talks on phone: Not on file    Gets together: Not on file    Attends religious service: Not on file    Active member of club or organization: Not on file    Attends meetings of clubs or organizations: Not on file    Relationship status: Not on file  . Intimate partner violence:    Fear of current or ex partner: Not on file    Emotionally abused: Not on file    Physically abused: Not on file    Forced sexual activity: Not on file  Other Topics Concern  . Not on file  Social History Narrative   Has two children. Divorced. Lives alone in [place]. Worked Architect. Smokes cigarettes. Eats all food groups.     Review of Systems: General: Negative for anorexia, weight loss, fever, chills, fatigue, weakness. ENT: Negative for hoarseness, difficulty swallowing , nasal congestion. CV: Negative for chest pain, angina, palpitations, dyspnea on exertion, peripheral edema.  Respiratory: Negative for dyspnea at rest, dyspnea on exertion, cough, sputum, wheezing.  GI: See history of present illness. Derm: Negative for rash or itching.  Neuro: Negative for memory loss, confusion.  Psych: Negative for anxiety, depression, suicidal ideation, hallucinations.  Endo: Negative for unusual weight change.  Heme: Negative for bruising or bleeding. Allergy: Negative for rash or hives.    Physical Exam: BP 130/68    Pulse 95   Temp (!) 97.5 F (36.4 C) (Oral)   Ht 5\' 1"  (1.549 m)   Wt 129 lb 12.8 oz (58.9 kg)   BMI 24.53 kg/m  General:   Alert and oriented. Pleasant and cooperative. Well-nourished and well-developed.  Eyes:  Without icterus, sclera clear and conjunctiva pink.  Ears:  Normal auditory acuity. Cardiovascular:  S1, S2 present without murmurs appreciated. Extremities without clubbing or edema. Respiratory:  Clear to auscultation bilaterally. No wheezes, rales, or rhonchi. No distress.  Gastrointestinal:  +BS, soft, and non-distended.  Mild generalized TTP. No HSM noted. No guarding or rebound. No masses appreciated.  Rectal:  Deferred  Musculoskalatal:  Symmetrical without gross deformities. Neurologic:  Alert and oriented x4;  grossly normal neurologically. Psych:  Alert and cooperative. Normal mood and affect. Heme/Lymph/Immune: No excessive bruising noted.    03/08/2018 9:34 AM   Disclaimer: This note was dictated with voice recognition software. Similar sounding words can inadvertently be transcribed and may not be corrected upon review.

## 2018-03-08 NOTE — Patient Instructions (Signed)
1. I am giving you samples of Linzess 72 mcg.  Take this once a day on an empty stomach. 2. Call us in 1 to 2 weeks and let us know if this is helping you have more regular, soft bowel movements. 3. Have your labs completed when you are able to. 4. We will help you schedule your ultrasound of your liver. 5. We will schedule your colonoscopy for you. 6. Further recommendations will be made after your colonoscopy. 7. Return for follow-up in 3 months. 8. Call us if you have any questions or concerns.  At [facility] we value your feedback. You may receive a survey about your visit today. Please share your experience as we strive to create trusting relationships with our patients to provide genuine, compassionate, quality care.  We appreciate your understanding and patience as we review any laboratory studies, imaging, and other diagnostic tests that are ordered as we care for you. Our office policy is 5 business days for review of these results, and any emergent or urgent results are addressed in a timely manner for your best interest. If you do not hear from our office in 1 week, please contact us.   We also encourage the use of MyChart, which contains your medical information for your review as well. If you are not enrolled in this feature, an access code is on this after visit summary for your convenience. Thank you for allowing [place] to be involved in your care.  It was great to meet you today!  I hope you have a great summer!!

## 2018-03-09 NOTE — Telephone Encounter (Signed)
Tried to call patient to discuss Dr.Haglers response. Number busy x 2. Will try again later.

## 2018-03-13 ENCOUNTER — Telehealth: Payer: Self-pay | Admitting: Nurse Practitioner

## 2018-03-13 ENCOUNTER — Other Ambulatory Visit: Payer: Self-pay | Admitting: Family Medicine

## 2018-03-13 NOTE — Telephone Encounter (Signed)
I will forward staff message from radiology requesting precert of newly ordered procedure. Let me know if any questions.

## 2018-03-13 NOTE — Telephone Encounter (Signed)
PA for US abdomen complete w/elastography submitted via [facility]. Case approved. PA# [id number], 03/13/18-04/12/18.

## 2018-03-15 NOTE — Telephone Encounter (Signed)
Called patient to discuss Dr.Hagler's recommendations. Line is busy. Will try again later.

## 2018-03-16 ENCOUNTER — Ambulatory Visit (HOSPITAL_COMMUNITY): Payer: Medicaid Other

## 2018-03-19 NOTE — Patient Instructions (Signed)
   Your procedure is scheduled on: 03/26/2018  Report to [facility] at  9:30   AM.  Call this number if you have problems the morning of surgery: [phone]   Remember:              Follow Directions on the letter you received from Your Physician's office regarding the Bowel Prep  :  Take these medicines the morning of surgery with A SIP OF WATER: Buspirone, Celexa, Synthroid and use albuterol inhaler   Do not wear jewelry, make-up or nail polish.    Do not bring valuables to the hospital.  Contacts, dentures or bridgework may not be worn into surgery.  .   Patients discharged the day of surgery will not be allowed to drive home.     Colonoscopy, Adult, Care After This sheet gives you information about how to care for yourself after your procedure. Your health care provider may also give you more specific instructions. If you have problems or questions, contact your health care provider. What can I expect after the procedure? After the procedure, it is common to have:  A small amount of blood in your stool for 24 hours after the procedure.  Some gas.  Mild abdominal cramping or bloating.  Follow these instructions at home: General instructions   For the first 24 hours after the procedure: ? Do not drive or use machinery. ? Do not sign important documents. ? Do not drink alcohol. ? Do your regular daily activities at a slower pace than normal. ? Eat soft, easy-to-digest foods. ? Rest often.  Take over-the-counter or prescription medicines only as told by your health care provider.  It is up to you to get the results of your procedure. Ask your health care provider, or the department performing the procedure, when your results will be ready. Relieving cramping and bloating  Try walking around when you have cramps or feel bloated.  Apply heat to your abdomen as told by your health care provider. Use a heat source that your health care provider recommends, such as a  moist heat pack or a heating pad. ? Place a towel between your skin and the heat source. ? Leave the heat on for 20-30 minutes. ? Remove the heat if your skin turns bright red. This is especially important if you are unable to feel pain, heat, or cold. You may have a greater risk of getting burned. Eating and drinking  Drink enough fluid to keep your urine clear or pale yellow.  Resume your normal diet as instructed by your health care provider. Avoid heavy or fried foods that are hard to digest.  Avoid drinking alcohol for as long as instructed by your health care provider. Contact a health care provider if:  You have blood in your stool 2-3 days after the procedure. Get help right away if:  You have more than a small spotting of blood in your stool.  You pass large blood clots in your stool.  Your abdomen is swollen.  You have nausea or vomiting.  You have a fever.  You have increasing abdominal pain that is not relieved with medicine. This information is not intended to replace advice given to you by your health care provider. Make sure you discuss any questions you have with your health care provider. Document Released: 02/09/2004 Document Revised: 03/21/2016 Document Reviewed: 09/08/2015 Elsevier Interactive Patient Education  [employer].

## 2018-03-20 ENCOUNTER — Encounter (HOSPITAL_COMMUNITY)
Admission: RE | Admit: 2018-03-20 | Discharge: 2018-03-20 | Disposition: A | Discharge status: Home / Self Care | Payer: Medicaid Other | Source: Ambulatory Visit | Attending: Internal Medicine | Admitting: Internal Medicine

## 2018-03-21 NOTE — Telephone Encounter (Signed)
Called patient. No answer, no vm. 3rd attempt.

## 2018-03-23 ENCOUNTER — Other Ambulatory Visit: Payer: Self-pay

## 2018-03-23 ENCOUNTER — Encounter (HOSPITAL_COMMUNITY)
Admission: RE | Admit: 2018-03-23 | Discharge: 2018-03-23 | Disposition: A | Discharge status: Home / Self Care | Payer: Medicaid Other | Source: Ambulatory Visit | Attending: Internal Medicine | Admitting: Internal Medicine

## 2018-03-23 ENCOUNTER — Encounter (HOSPITAL_COMMUNITY): Payer: Self-pay

## 2018-03-23 DIAGNOSIS — Z01818 Encounter for other preprocedural examination: Secondary | ICD-10-CM | POA: Diagnosis not present

## 2018-03-23 HISTORY — DX: Hypothyroidism, unspecified: E03.9

## 2018-03-23 LAB — COMPREHENSIVE METABOLIC PANEL
ALK PHOS: 65 U/L (ref 38–126)
ALT: 39 U/L (ref 0–44)
ANION GAP: 9 (ref 5–15)
AST: 44 U/L — ABNORMAL HIGH (ref 15–41)
Albumin: 4.1 g/dL (ref 3.5–5.0)
BILIRUBIN TOTAL: 0.7 mg/dL (ref 0.3–1.2)
BUN: 11 mg/dL (ref 6–20)
CALCIUM: 9.1 mg/dL (ref 8.9–10.3)
CO2: 26 mmol/L (ref 22–32)
CREATININE: 0.71 mg/dL (ref 0.44–1.00)
Chloride: 106 mmol/L (ref 98–111)
GFR calc non Af Amer: >60 mL/min (ref >60)
Glucose, Bld: 89 mg/dL (ref 70–99)
Potassium: 3.7 mmol/L (ref 3.5–5.1)
SODIUM: 141 mmol/L (ref 135–145)
TOTAL PROTEIN: 7.7 g/dL (ref 6.5–8.1)

## 2018-03-23 LAB — CBC
HCT: 41.3 % (ref 36.0–46.0)
Hemoglobin: 13.7 g/dL (ref 12.0–15.0)
MCH: 30.9 pg (ref 26.0–34.0)
MCHC: 33.2 g/dL (ref 30.0–36.0)
MCV: 93 fL (ref 78.0–100.0)
Platelets: 168 10*3/uL (ref 150–400)
RBC: 4.44 MIL/uL (ref 3.87–5.11)
RDW: 14.7 % (ref 11.5–15.5)
WBC: 7.5 10*3/uL (ref 4.0–10.5)

## 2018-03-23 LAB — PROTIME-INR
INR: 0.99
Prothrombin Time: 12.9 seconds (ref 11.4–15.2)

## 2018-03-23 LAB — RAPID URINE DRUG SCREEN, HOSP PERFORMED
AMPHETAMINES: NOT DETECTED
BENZODIAZEPINES: POSITIVE — AB
Barbiturates: NOT DETECTED
COCAINE: NOT DETECTED
OPIATES: NOT DETECTED
TETRAHYDROCANNABINOL: NOT DETECTED

## 2018-03-26 ENCOUNTER — Ambulatory Visit (HOSPITAL_COMMUNITY): Payer: Medicaid Other | Admitting: Anesthesiology

## 2018-03-26 ENCOUNTER — Ambulatory Visit (HOSPITAL_COMMUNITY)
Admission: RE | Admit: 2018-03-26 | Discharge: 2018-03-26 | Disposition: A | Discharge status: Home / Self Care | Payer: Medicaid Other | Source: Ambulatory Visit | Attending: Internal Medicine | Admitting: Internal Medicine

## 2018-03-26 ENCOUNTER — Encounter (HOSPITAL_COMMUNITY): Payer: Self-pay | Admitting: *Deleted

## 2018-03-26 ENCOUNTER — Encounter (HOSPITAL_COMMUNITY)
Admission: RE | Disposition: A | Discharge status: Home / Self Care | Payer: Self-pay | Source: Ambulatory Visit | Attending: Internal Medicine

## 2018-03-26 ENCOUNTER — Other Ambulatory Visit: Payer: Self-pay

## 2018-03-26 DIAGNOSIS — D123 Benign neoplasm of transverse colon: Secondary | ICD-10-CM | POA: Insufficient documentation

## 2018-03-26 DIAGNOSIS — R197 Diarrhea, unspecified: Secondary | ICD-10-CM | POA: Insufficient documentation

## 2018-03-26 DIAGNOSIS — E039 Hypothyroidism, unspecified: Secondary | ICD-10-CM | POA: Diagnosis not present

## 2018-03-26 DIAGNOSIS — J45909 Unspecified asthma, uncomplicated: Secondary | ICD-10-CM | POA: Diagnosis not present

## 2018-03-26 DIAGNOSIS — F17210 Nicotine dependence, cigarettes, uncomplicated: Secondary | ICD-10-CM | POA: Insufficient documentation

## 2018-03-26 DIAGNOSIS — I10 Essential (primary) hypertension: Secondary | ICD-10-CM | POA: Diagnosis not present

## 2018-03-26 DIAGNOSIS — B1920 Unspecified viral hepatitis C without hepatic coma: Secondary | ICD-10-CM | POA: Insufficient documentation

## 2018-03-26 DIAGNOSIS — F419 Anxiety disorder, unspecified: Secondary | ICD-10-CM | POA: Diagnosis not present

## 2018-03-26 DIAGNOSIS — K640 First degree hemorrhoids: Secondary | ICD-10-CM | POA: Diagnosis not present

## 2018-03-26 DIAGNOSIS — K921 Melena: Secondary | ICD-10-CM | POA: Diagnosis not present

## 2018-03-26 DIAGNOSIS — Z79899 Other long term (current) drug therapy: Secondary | ICD-10-CM | POA: Diagnosis not present

## 2018-03-26 HISTORY — PX: COLONOSCOPY WITH PROPOFOL: SHX5780

## 2018-03-26 HISTORY — PX: POLYPECTOMY: SHX5525

## 2018-03-26 SURGERY — COLONOSCOPY WITH PROPOFOL
Anesthesia: General

## 2018-03-26 MED ORDER — LACTATED RINGERS IV SOLN
INTRAVENOUS | Status: DC | PRN
  Administered 2018-03-26: 11:00:00 via INTRAVENOUS

## 2018-03-26 MED ORDER — PROPOFOL 10 MG/ML IV BOLUS
INTRAVENOUS | Status: DC | PRN
  Administered 2018-03-26: 40 mg via INTRAVENOUS

## 2018-03-26 MED ORDER — PROPOFOL 500 MG/50ML IV EMUL
INTRAVENOUS | Status: DC | PRN
  Administered 2018-03-26: 150 ug/kg/min via INTRAVENOUS

## 2018-03-26 NOTE — Transfer of Care (Signed)
Immediate Anesthesia Transfer of Care Note  Patient: Monica [name]  Procedure(s) Performed: COLONOSCOPY WITH PROPOFOL (N/A ) POLYPECTOMY  Patient Location: PACU  Anesthesia Type:MAC  Level of Consciousness: awake, alert , oriented and patient cooperative  Airway & Oxygen Therapy: Patient Spontanous Breathing  Post-op Assessment: Report given to RN and Post -op Vital signs reviewed and stable  Post vital signs: Reviewed and stable  Last Vitals:  Vitals Value Taken Time  BP    Temp    Pulse 87 03/26/2018 11:25 AM  Resp    SpO2 100 % 03/26/2018 11:25 AM  Vitals shown include unvalidated device data.  Last Pain:  Vitals:   03/26/18 1044  TempSrc: Oral  PainSc:          Complications: No apparent anesthesia complications

## 2018-03-26 NOTE — Anesthesia Preprocedure Evaluation (Addendum)
Anesthesia Evaluation  Patient identified by MRN, date of birth, ID band Patient awake    Reviewed: Allergy & Precautions, H&P , NPO status , Patient's Chart, lab work & pertinent test results  Airway Mallampati: II  TM Distance: >3 FB Neck ROM: full    Dental no notable dental hx.    Pulmonary neg pulmonary ROS, asthma , Current Smoker,    Pulmonary exam normal breath sounds clear to auscultation       Cardiovascular Exercise Tolerance: Good hypertension, negative cardio ROS   Rhythm:regular Rate:Normal     Neuro/Psych PSYCHIATRIC DISORDERS Anxiety negative neurological ROS  negative psych ROS   GI/Hepatic negative GI ROS, Neg liver ROS, (+) Hepatitis -, C  Endo/Other  negative endocrine ROSHypothyroidism   Renal/GU negative Renal ROS  negative genitourinary   Musculoskeletal   Abdominal   Peds  Hematology negative hematology ROS (+)   Anesthesia Other Findings Late entry, patient seen/evaluated pre-procedure.. VW  Reproductive/Obstetrics negative OB ROS                            Anesthesia Physical Anesthesia Plan  ASA: III  Anesthesia Plan: General   Post-op Pain Management:    Induction:   PONV Risk Score and Plan:   Airway Management Planned:   Additional Equipment:   Intra-op Plan:   Post-operative Plan:   Informed Consent: I have reviewed the patients History and Physical, chart, labs and discussed the procedure including the risks, benefits and alternatives for the proposed anesthesia with the patient or authorized representative who has indicated his/her understanding and acceptance.   Dental Advisory Given  Plan Discussed with: CRNA  Anesthesia Plan Comments:         Anesthesia Quick Evaluation

## 2018-03-26 NOTE — Op Note (Signed)
[facility] Patient Name: Monica [name] Procedure Date: 03/26/2018 10:42 AM MRN: [id number] Date of Birth: Dec 31, 1955 Attending MD: Norvel Richards , MD CSN: [id number] Age: 62 Admit Type: Outpatient Procedure:                Colonoscopy Indications:              Hematochezia Providers:                Norvel Richards, MD, Lurline Del, RN, Nelma Rothman, Technician Referring MD:             Caren Macadam Medicines:                Propofol per Anesthesia Complications:            No immediate complications. Estimated Blood Loss:     Estimated blood loss was minimal. Procedure:                Pre-Anesthesia Assessment:                           - Prior to the procedure, a History and Physical                            was performed, and patient medications and                            allergies were reviewed. The patient's tolerance of                            previous anesthesia was also reviewed. The risks                            and benefits of the procedure and the sedation                            options and risks were discussed with the patient.                            All questions were answered, and informed consent                            was obtained. Prior Anticoagulants: The patient has                            taken no previous anticoagulant or antiplatelet                            agents. ASA Grade Assessment: II - A patient with                            mild systemic disease. After reviewing the risks  and benefits, the patient was deemed in                            satisfactory condition to undergo the procedure.                           After obtaining informed consent, the colonoscope                            was passed under direct vision. Throughout the                            procedure, the patient's blood pressure, pulse, and                            oxygen saturations  were monitored continuously. The                            [id number]-[id number] ([id number]) scope was introduced through                            the and advanced to the the cecum, identified by                            appendiceal orifice and ileocecal valve. The                            colonoscopy was performed without difficulty. The                            patient tolerated the procedure well. The quality                            of the bowel preparation was adequate. The                            ileocecal valve, appendiceal orifice, and rectum                            were photographed. The quality of the bowel                            preparation was adequate. Scope In: 10:59:41 AM Scope Out: 11:14:52 AM Scope Withdrawal Time: 0 hours 10 minutes 7 seconds  Total Procedure Duration: 0 hours 15 minutes 11 seconds  Findings:      The perianal and digital rectal examinations were normal.      Internal hemorrhoids were found during retroflexion. The hemorrhoids       were moderate, medium-sized and Grade I (internal hemorrhoids that do       not prolapse).      A 5 mm polyp was found in the splenic flexure. The polyp was sessile.      The exam was otherwise without abnormality on direct and retroflexion       views. Impression:               -  Internal hemorrhoids.                           - One 5 mm polyp at the splenic flexure.                           - The examination was otherwise normal on direct                            and retroflexion views.                           - No specimens collected. Moderate Sedation:      Moderate (conscious) sedation was personally administered by an       anesthesia professional. The following parameters were monitored: oxygen       saturation, heart rate, blood pressure, respiratory rate, EKG, adequacy       of pulmonary ventilation, and response to care. Total physician       intraservice time was 21 minutes. Recommendation:            - Patient has a contact number available for                            emergencies. The signs and symptoms of potential                            delayed complications were discussed with the                            patient. Return to normal activities tomorrow.                            Written discharge instructions were provided to the                            patient.                           - Advance diet as tolerated.                           - Continue present medications. continue Linzess 72                            daily. Anusol cream to the anorectum twice daily.                           - Repeat colonoscopy date to be determined after                            pending pathology results are reviewed for                            surveillance.                           -  Return to GI office in 3 months. Procedure Code(s):        --- Professional ---                           [id number], Colonoscopy, flexible; diagnostic, including                            collection of specimen(s) by brushing or washing,                            when performed (separate procedure) Diagnosis Code(s):        --- Professional ---                           D12.3, Benign neoplasm of transverse colon (hepatic                            flexure or splenic flexure)                           K64.0, First degree hemorrhoids                           K92.1, Melena (includes Hematochezia) CPT copyright 2017 American Medical Association. All rights reserved. The codes documented in this report are preliminary and upon coder review may  be revised to meet current compliance requirements. [name], MD Norvel Richards, MD 03/26/2018 11:22:43 AM This report has been signed electronically. Number of Addenda: 0

## 2018-03-26 NOTE — Discharge Instructions (Signed)
°Colonoscopy °Discharge Instructions ° °Read the instructions outlined below and refer to this sheet in the next few weeks. These discharge instructions provide you with general information on caring for yourself after you leave the hospital. Your doctor may also give you specific instructions. While your treatment has been planned according to the most current medical practices available, unavoidable complications occasionally occur. If you have any problems or questions after discharge, call Dr. Rourk at [phone]. °ACTIVITY °· You may resume your regular activity, but move at a slower pace for the next 24 hours.  °· Take frequent rest periods for the next 24 hours.  °· Walking will help get rid of the air and reduce the bloated feeling in your belly (abdomen).  °· No driving for 24 hours (because of the medicine (anesthesia) used during the test).   °· Do not sign any important legal documents or operate any machinery for 24 hours (because of the anesthesia used during the test).  °NUTRITION °· Drink plenty of fluids.  °· You may resume your normal diet as instructed by your doctor.  °· Begin with a light meal and progress to your normal diet. Heavy or fried foods are harder to digest and may make you feel sick to your stomach (nauseated).  °· Avoid alcoholic beverages for 24 hours or as instructed.  °MEDICATIONS °· You may resume your normal medications unless your doctor tells you otherwise.  °WHAT YOU CAN EXPECT TODAY °· Some feelings of bloating in the abdomen.  °· Passage of more gas than usual.  °· Spotting of blood in your stool or on the toilet paper.  °IF YOU HAD POLYPS REMOVED DURING THE COLONOSCOPY: °· No aspirin products for 7 days or as instructed.  °· No alcohol for 7 days or as instructed.  °· Eat a soft diet for the next 24 hours.  °FINDING OUT THE RESULTS OF YOUR TEST °Not all test results are available during your visit. If your test results are not back during the visit, make an appointment  with your caregiver to find out the results. Do not assume everything is normal if you have not heard from your caregiver or the medical facility. It is important for you to follow up on all of your test results.  °SEEK IMMEDIATE MEDICAL ATTENTION IF: °· You have more than a spotting of blood in your stool.  °· Your belly is swollen (abdominal distention).  °· You are nauseated or vomiting.  °· You have a temperature over 101.  °· You have abdominal pain or discomfort that is severe or gets worse throughout the day.  ° °Colon Polyps °Polyps are tissue growths inside the body. Polyps can grow in many places, including the large intestine (colon). A polyp may be a round bump or a mushroom-shaped growth. You could have one polyp or several. °Most colon polyps are noncancerous (benign). However, some colon polyps can become cancerous over time. °What are the causes? °The exact cause of colon polyps is not known. °What increases the risk? °This condition is more likely to develop in people who: °· Have a family history of colon cancer or colon polyps. °· Are older than 50 or older than 45 if they are African American. °· Have inflammatory bowel disease, such as ulcerative colitis or Crohn disease. °· Are overweight. °· Smoke cigarettes. °· Do not get enough exercise. °· Drink too much alcohol. °· Eat a diet that is: °? High in fat and red meat. °? Low in fiber. °·   fiber.  Had childhood cancer that was treated with abdominal radiation.  What are the signs or symptoms? Most polyps do not cause symptoms. If you have symptoms, they may include:  Blood coming from your rectum when having a bowel movement.  Blood in your stool.The stool may look dark red or black.  A change in bowel habits, such as constipation or diarrhea.  How is this diagnosed? This condition is diagnosed with a colonoscopy. This is a procedure that uses a lighted, flexible scope to look at the inside of your colon. How is this treated? Treatment  for this condition involves removing any polyps that are found. Those polyps will then be tested for cancer. If cancer is found, your health care provider will talk to you about options for colon cancer treatment. Follow these instructions at home: Diet  Eat plenty of fiber, such as fruits, vegetables, and whole grains.  Eat foods that are high in calcium and vitamin D, such as milk, cheese, yogurt, eggs, liver, fish, and broccoli.  Limit foods high in fat, red meats, and processed meats, such as hot dogs, sausage, bacon, and lunch meats.  Maintain a healthy weight, or lose weight if recommended by your health care provider. General instructions  Do not smoke cigarettes.  Do not drink alcohol excessively.  Keep all follow-up visits as told by your health care provider. This is important. This includes keeping regularly scheduled colonoscopies. Talk to your health care provider about when you need a colonoscopy.  Exercise every day or as told by your health care provider. Contact a health care provider if:  You have new or worsening bleeding during a bowel movement.  You have new or increased blood in your stool.  You have a change in bowel habits.  You unexpectedly lose weight. This information is not intended to replace advice given to you by your health care provider. Make sure you discuss any questions you have with your health care provider. Document Released: 03/23/2004 Document Revised: 12/03/2015 Document Reviewed: 05/18/2015 Elsevier Interactive Patient Education  2018 [employer].   Hemorrhoids Hemorrhoids are swollen veins in and around the rectum or anus. There are two types of hemorrhoids:  Internal hemorrhoids. These occur in the veins that are just inside the rectum. They may poke through to the outside and become irritated and painful.  External hemorrhoids. These occur in the veins that are outside of the anus and can be felt as a painful swelling or hard lump  near the anus.  Most hemorrhoids do not cause serious problems, and they can be managed with home treatments such as diet and lifestyle changes. If home treatments do not help your symptoms, procedures can be done to shrink or remove the hemorrhoids. What are the causes? This condition is caused by increased pressure in the anal area. This pressure may result from various things, including:  Constipation.  Straining to have a bowel movement.  Diarrhea.  Pregnancy.  Obesity.  Sitting for long periods of time.  Heavy lifting or other activity that causes you to strain.  Anal sex.  What are the signs or symptoms? Symptoms of this condition include:  Pain.  Anal itching or irritation.  Rectal bleeding.  Leakage of stool (feces).  Anal swelling.  One or more lumps around the anus.  How is this diagnosed? This condition can often be diagnosed through a visual exam. Other exams or tests may also be done, such as:  Examination of the rectal area with a  gloved hand (digital rectal exam).  Examination of the anal canal using a small tube (anoscope).  A blood test, if you have lost a significant amount of blood.  A test to look inside the colon (sigmoidoscopy or colonoscopy).  How is this treated? This condition can usually be treated at home. However, various procedures may be done if dietary changes, lifestyle changes, and other home treatments do not help your symptoms. These procedures can help make the hemorrhoids smaller or remove them completely. Some of these procedures involve surgery, and others do not. Common procedures include:  Rubber band ligation. Rubber bands are placed at the base of the hemorrhoids to cut off the blood supply to them.  Sclerotherapy. Medicine is injected into the hemorrhoids to shrink them.  Infrared coagulation. A type of light energy is used to get rid of the hemorrhoids.  Hemorrhoidectomy surgery. The hemorrhoids are surgically  removed, and the veins that supply them are tied off.  Stapled hemorrhoidopexy surgery. A circular stapling device is used to remove the hemorrhoids and use staples to cut off the blood supply to them.  Follow these instructions at home: Eating and drinking  Eat foods that have a lot of fiber in them, such as whole grains, beans, nuts, fruits, and vegetables. Ask your health care provider about taking products that have added fiber (fiber supplements).  Drink enough fluid to keep your urine clear or pale yellow. Managing pain and swelling  Take warm sitz baths for 20 minutes, 3-4 times a day to ease pain and discomfort.  If directed, apply ice to the affected area. Using ice packs between sitz baths may be helpful. ? Put ice in a plastic bag. ? Place a towel between your skin and the bag. ? Leave the ice on for 20 minutes, 2-3 times a day. General instructions  Take over-the-counter and prescription medicines only as told by your health care provider.  Use medicated creams or suppositories as told.  Exercise regularly.  Go to the bathroom when you have the urge to have a bowel movement. Do not wait.  Avoid straining to have bowel movements.  Keep the anal area dry and clean. Use wet toilet paper or moist towelettes after a bowel movement.  Do not sit on the toilet for long periods of time. This increases blood pooling and pain. Contact a health care provider if:  You have increasing pain and swelling that are not controlled by treatment or medicine.  You have uncontrolled bleeding.  You have difficulty having a bowel movement, or you are unable to have a bowel movement.  You have pain or inflammation outside the area of the hemorrhoids. This information is not intended to replace advice given to you by your health care provider. Make sure you discuss any questions you have with your health care provider. Document Released: 06/24/2000 Document Revised: 11/25/2015 Document  Reviewed: 03/11/2015 Elsevier Interactive Patient Education  2018 [employer].     Colon polyp and hemorrhoid information provided  Continued Linzess 72 daily  Anusol cream to rectum twice daily  Further recommendations to follow pending review of pathology report  Office visit with [place] in 3 months

## 2018-03-26 NOTE — Interval H&P Note (Signed)
History and Physical Interval Note:  03/26/2018 10:53 AM  Monica [name]  has presented today for surgery, with the diagnosis of rectal bleeding  The various methods of treatment have been discussed with the patient and family. After consideration of risks, benefits and other options for treatment, the patient has consented to  Procedure(s) with comments: COLONOSCOPY WITH PROPOFOL (N/A) - 11:15am as a surgical intervention .  The patient's history has been reviewed, patient examined, no change in status, stable for surgery.  I have reviewed the patient's chart and labs.  Questions were answered to the patient's satisfaction.     Tareek Sabo  No change. Diagnostic colonoscopy per plan.  The risks, benefits, limitations, alternatives and imponderables have been reviewed with the patient. Questions have been answered. All parties are agreeable.

## 2018-03-26 NOTE — Anesthesia Postprocedure Evaluation (Signed)
Anesthesia Post Note  Patient: Monica [name]  Procedure(s) Performed: COLONOSCOPY WITH PROPOFOL (N/A ) POLYPECTOMY  Patient location during evaluation: PACU Level of consciousness: awake and alert and oriented Pain management: pain level controlled Vital Signs Assessment: post-procedure vital signs reviewed and stable Respiratory status: spontaneous breathing Cardiovascular status: stable Postop Assessment: no apparent nausea or vomiting Anesthetic complications: no     Last Vitals:  Vitals:   03/26/18 1044  BP: 107/86  Pulse: 82  Resp: 20  Temp: 36.5 C  SpO2: 98%    Last Pain:  Vitals:   03/26/18 1044  TempSrc: Oral  PainSc:                  Makaelyn Aponte A

## 2018-03-27 ENCOUNTER — Encounter: Payer: Self-pay | Admitting: Internal Medicine

## 2018-03-27 NOTE — Addendum Note (Signed)
Addendum  created 03/27/18 0830 by Nicanor Alcon, MD   Attestation recorded in [place], [place] filed

## 2018-03-27 NOTE — Addendum Note (Signed)
Addendum  created 03/27/18 0812 by Vista Deck, CRNA   Intraprocedure Staff edited

## 2018-03-28 ENCOUNTER — Other Ambulatory Visit: Payer: Self-pay | Admitting: Family Medicine

## 2018-04-02 ENCOUNTER — Other Ambulatory Visit: Payer: Self-pay | Admitting: Family Medicine

## 2018-04-09 ENCOUNTER — Telehealth: Payer: Self-pay

## 2018-04-09 ENCOUNTER — Other Ambulatory Visit: Payer: Self-pay

## 2018-04-09 DIAGNOSIS — B182 Chronic viral hepatitis C: Secondary | ICD-10-CM

## 2018-04-09 NOTE — Telephone Encounter (Signed)
EG when given her lab results, she mentioned that she was going to call today. She is taking Linzess 72 mcg and has some bloating, hardening of her abdomen and loose stools. Pt is staying at home due to not having any control over her bowels. Pt can't feel when it's time to have a bowel movement and messes up her clothing. Please advise.

## 2018-04-10 NOTE — Telephone Encounter (Signed)
Noted. Spoke with pt. Pt is going to call back with a progress report in a few days as directed and hold off of Linzess.

## 2018-04-10 NOTE — Telephone Encounter (Signed)
Hold the [place] for now. Lets check back with her in a couple days and see how she's doing. If constipation returns may need a different medication. If still having persistent diarrhea, will likely need stool studies.

## 2018-04-11 ENCOUNTER — Encounter (HOSPITAL_COMMUNITY): Payer: Self-pay | Admitting: Internal Medicine

## 2018-04-17 ENCOUNTER — Telehealth: Payer: Self-pay | Admitting: Family Medicine

## 2018-04-17 NOTE — Telephone Encounter (Signed)
PATIENT LVM TO SCHEDULE APPT W/ DR.HAGLER, I CALLED HER BACK TO LET HER KNOW DR.HAGLER IS NO LONGER AT [facility], NO ANSWER/ NO VOICEMAIL.

## 2018-04-24 ENCOUNTER — Ambulatory Visit: Payer: Medicaid Other | Admitting: Gastroenterology

## 2018-04-28 ENCOUNTER — Other Ambulatory Visit: Payer: Self-pay | Admitting: Family Medicine

## 2018-05-08 ENCOUNTER — Other Ambulatory Visit: Payer: Self-pay | Admitting: Family Medicine

## 2018-05-14 ENCOUNTER — Other Ambulatory Visit: Payer: Self-pay | Admitting: Family Medicine

## 2018-05-24 ENCOUNTER — Other Ambulatory Visit: Payer: Self-pay

## 2018-05-24 DIAGNOSIS — B182 Chronic viral hepatitis C: Secondary | ICD-10-CM

## 2018-06-05 ENCOUNTER — Ambulatory Visit: Payer: Medicaid Other | Admitting: Gastroenterology

## 2018-06-13 ENCOUNTER — Ambulatory Visit: Payer: Medicaid Other | Admitting: Nurse Practitioner

## 2018-06-13 NOTE — Progress Notes (Deleted)
Referring Provider: Caren Macadam, MD Primary Care Physician:  No primary care provider on file. Primary GI:  Dr. Gala Romney  No chief complaint on file.   HPI:   Monica [name] is a 62 y.o. female who presents for follow-up on constipation, hepatitis C, rectal bleeding.  The patient was last seen in our office 03/08/2018 for the same.  At the time of her referral primary care office visit 02/28/2018 she had been off all drugs and going to [facility].  Chronic history of constipation and diarrhea.  Hepatitis C work-up was positive for RNA at 17 million copies, mild transaminitis, HIV negative.  History of cholelithiasis without cholecystitis.  Chronic history of diarrhea and at some point was told by somebody that she needs to have her gallbladder out.  Will typically go to 3 days without a bowel movement and then will have a day of diarrhea.  Likely constipation with subsequent follow-up overflow diarrhea.  Toilet tissue hematochezia with every bowel movement.  Never had a colonoscopy before.  Recommended Linzess 72 mcg, progress report 1 to 2 weeks, further liver work-up, ultrasound elastography of the liver, colonoscopy.  Follow-up in 3 months.  It does not appear hepatitis C follow-up including genotype were completed.  Preop labs found normal CBC with a platelet count of 168, CMP with mild bump in AST at 44 which would be expected, INR 0.99.  It does not appear ultrasound elastography was completed.  Colonoscopy was completed 03/26/2018 which found internal hemorrhoids, a single 5 mm polyp at the splenic flexure, otherwise normal.  Recommended continue Linzess.  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  Recommended repeat colonoscopy in 7 years.  Today she states   Past Medical History:  Diagnosis Date  . Anxiety   . Asthma   . Hepatitis C   . Hepatitis C   . History of drug abuse ([place])   . Hypertension   . Hypothyroidism   . Thyroid disease     Past Surgical History:    Procedure Laterality Date  . COLONOSCOPY WITH PROPOFOL N/A 03/26/2018   Procedure: COLONOSCOPY WITH PROPOFOL;  Surgeon: Daneil Dolin, MD;  Location: [facility];  Service: Endoscopy;  Laterality: N/A;  11:15am  . POLYPECTOMY  03/26/2018   Procedure: POLYPECTOMY;  Surgeon: Daneil Dolin, MD;  Location: [facility];  Service: Endoscopy;;  splenic flexure  . TUBAL LIGATION      Current Outpatient Medications  Medication Sig Dispense Refill  . Ascorbic Acid (VITAMIN C PO) Take 1 tablet by mouth daily as needed (immune support).    . Aspirin-Acetaminophen (GOODYS BODY PAIN PO) Take 1 packet by mouth daily as needed (pain).    . busPIRone (BUSPAR) 30 MG tablet TAKE ONE TABLET BY MOUTH TWICE DAILY. 60 tablet 3  . CATAPRES-TTS-2 0.2 MG/24HR patch APPLY ONE PATCH ONTO THE SKIN ONCE A WEEK. 4 patch 0  . cholecalciferol (VITAMIN D) 1000 units tablet Take 1,000 Units by mouth daily.    . citalopram (CELEXA) 20 MG tablet Take 20 mg by mouth daily.  2  . gabapentin (NEURONTIN) 300 MG capsule TAKE ONE CAPSULE BY MOUTH THREE TIMES DAILY. 90 capsule 0  . levothyroxine (SYNTHROID, LEVOTHROID) 75 MCG tablet TAKE ONE TABLET BY MOUTH DAILY BEFORE BREAKFAST. (Patient taking differently: Take 75 mcg by mouth daily before breakfast. ) 30 tablet 3  . PROAIR HFA 108 (90 Base) MCG/ACT inhaler INHALE TWO PUFFS BY MOUTH EVERY 4 TO 6 HOURS  AS NEEDED 8.5 g 2  . QUEtiapine (SEROQUEL) 400 MG tablet TAKE 1/2 TABLET BY MOUTH AT BEDTIME. (Patient taking differently: Take 200 mg by mouth at bedtime. ) 15 tablet 3  . traZODone (DESYREL) 150 MG tablet Take 150 mg by mouth at bedtime.   2   No current facility-administered medications for this visit.     Allergies as of 06/13/2018  . (No Known Allergies)    Family History  Problem Relation Age of Onset  . Lung cancer Mother   . Heart disease Father   . Hypertension Sister   . Diabetes Maternal Grandmother   . Stroke Maternal Grandmother   . Colon cancer Neg Hx      Social History   Socioeconomic History  . Marital status: Divorced    Spouse name: Not on file  . Number of children: Not on file  . Years of education: Not on file  . Highest education level: Not on file  Occupational History  . Not on file  Social Needs  . Financial resource strain: Not on file  . Food insecurity:    Worry: Not on file    Inability: Not on file  . Transportation needs:    Medical: Not on file    Non-medical: Not on file  Tobacco Use  . Smoking status: Current Every Day Smoker    Packs/day: 1.00    Years: 45.00    Pack years: 45.00    Types: Cigarettes  . Smokeless tobacco: Never Used  Substance and Sexual Activity  . Alcohol use: Not Currently  . Drug use: Not Currently    Types: Cocaine, Marijuana, Methamphetamines    Comment: last used 4 months ago- sts 03/23/2018.  Marland Kitchen Sexual activity: Not Currently    Birth control/protection: Surgical  Lifestyle  . Physical activity:    Days per week: Not on file    Minutes per session: Not on file  . Stress: Not on file  Relationships  . Social connections:    Talks on phone: Not on file    Gets together: Not on file    Attends religious service: Not on file    Active member of club or organization: Not on file    Attends meetings of clubs or organizations: Not on file    Relationship status: Not on file  Other Topics Concern  . Not on file  Social History Narrative   Has two children. Divorced. Lives alone in [place]. Worked Architect. Smokes cigarettes. Eats all food groups.     Review of Systems: General: Negative for anorexia, weight loss, fever, chills, fatigue, weakness. Eyes: Negative for vision changes.  ENT: Negative for hoarseness, difficulty swallowing , nasal congestion. CV: Negative for chest pain, angina, palpitations, dyspnea on exertion, peripheral edema.  Respiratory: Negative for dyspnea at rest, dyspnea on exertion, cough, sputum, wheezing.  GI: See history of present  illness. GU:  Negative for dysuria, hematuria, urinary incontinence, urinary frequency, nocturnal urination.  MS: Negative for joint pain, low back pain.  Derm: Negative for rash or itching.  Neuro: Negative for weakness, abnormal sensation, seizure, frequent headaches, memory loss, confusion.  Psych: Negative for anxiety, depression, suicidal ideation, hallucinations.  Endo: Negative for unusual weight change.  Heme: Negative for bruising or bleeding. Allergy: Negative for rash or hives.   Physical Exam: There were no vitals taken for this visit. General:   Alert and oriented. Pleasant and cooperative. Well-nourished and well-developed.  Head:  Normocephalic and atraumatic. Eyes:  Without icterus,  sclera clear and conjunctiva pink.  Ears:  Normal auditory acuity. Mouth:  No deformity or lesions, oral mucosa pink.  Throat/Neck:  Supple, without mass or thyromegaly. Cardiovascular:  S1, S2 present without murmurs appreciated. Normal pulses noted. Extremities without clubbing or edema. Respiratory:  Clear to auscultation bilaterally. No wheezes, rales, or rhonchi. No distress.  Gastrointestinal:  +BS, soft, non-tender and non-distended. No HSM noted. No guarding or rebound. No masses appreciated.  Rectal:  Deferred  Musculoskalatal:  Symmetrical without gross deformities. Normal posture. Skin:  Intact without significant lesions or rashes. Neurologic:  Alert and oriented x4;  grossly normal neurologically. Psych:  Alert and cooperative. Normal mood and affect. Heme/Lymph/Immune: No significant cervical adenopathy. No excessive bruising noted.    06/13/2018 7:57 AM   Disclaimer: This note was dictated with voice recognition software. Similar sounding words can inadvertently be transcribed and may not be corrected upon review.

## 2018-06-14 ENCOUNTER — Other Ambulatory Visit: Payer: Self-pay | Admitting: Family Medicine

## 2018-08-30 ENCOUNTER — Telehealth: Payer: Self-pay | Admitting: Nurse Practitioner

## 2018-08-30 ENCOUNTER — Encounter: Payer: Self-pay | Admitting: Nurse Practitioner

## 2018-08-30 ENCOUNTER — Ambulatory Visit: Payer: Medicaid Other | Admitting: Nurse Practitioner

## 2018-08-30 NOTE — Progress Notes (Deleted)
Referring Provider: Caren Macadam, MD Primary Care Physician:  No primary care provider on file. Primary GI:  Dr. Gala Romney  No chief complaint on file.   HPI:   Monica [name] is a 63 y.o. female who presents for follow-up on constipation, hepatitis C, rectal bleeding.  The patient was last seen in our office 03/08/2018 for the same.  Previously it was noted she was hepatitis C positive with RNA confirming at over 17 million copies.  Negative for hepatitis a and B, HIV negative.  Hepatic function panel with mild transaminitis and AST/ALT of 64/57.  At her last visit she noted ongoing constipation and diarrhea for years, has previously been told she has gallstones and needs her gallbladder out.  Ultrasound from [facility] with cholelithiasis without cholecystitis in 2018.  Has since abstain from drugs.  Her stool pattern is constipation for 2 to 3 days and then a day where she has diarrhea all day with an initial hard stool and infrequent diarrhea after that.  Toilet tissue hematochezia with a bowel movement.  No other GI complaints.  Has never had a colonoscopy.  Recommended Linzess 72 mcg samples, updated labs and ultrasound, colonoscopy, follow-up in 3 months.  Labs completed 03/23/2018 which found normal CBC, CMP with mild elevation in AST at 44, INR normal at 0.99.  It appears ultrasound elastography was not completed.  Colonoscopy completed 03/26/2018 which found internal hemorrhoids, a single 5 mm polyp, no other findings.  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 Recommended continue Linzess, Anusol cream twice daily, repeat colonoscopy in 7 years.  Her previous follow-up appointment in December 2019 was canceled and rescheduled.  Today she states   Past Medical History:  Diagnosis Date  . Anxiety   . Asthma   . Hepatitis C   . Hepatitis C   . History of drug abuse ([place])   . Hypertension   . Hypothyroidism   . Thyroid disease     Past Surgical History:    Procedure Laterality Date  . COLONOSCOPY WITH PROPOFOL N/A 03/26/2018   Procedure: COLONOSCOPY WITH PROPOFOL;  Surgeon: Daneil Dolin, MD;  [facility];  Service: Endoscopy;  Laterality: N/A;  11:15am  . POLYPECTOMY  03/26/2018   Procedure: POLYPECTOMY;  Surgeon: Daneil Dolin, MD;  Location[facility];  Service: Endoscopy;;  splenic flexure  . TUBAL LIGATION      Current Outpatient Medications  Medication Sig Dispense Refill  . Ascorbic Acid (VITAMIN C PO) Take 1 tablet by mouth daily as needed (immune support).    . Aspirin-Acetaminophen (GOODYS BODY PAIN PO) Take 1 packet by mouth daily as needed (pain).    . busPIRone (BUSPAR) 30 MG tablet TAKE ONE TABLET BY MOUTH TWICE DAILY. 60 tablet 3  . CATAPRES-TTS-2 0.2 MG/24HR patch APPLY ONE PATCH ONTO THE SKIN ONCE A WEEK. 4 patch 0  . cholecalciferol (VITAMIN D) 1000 units tablet Take 1,000 Units by mouth daily.    . citalopram (CELEXA) 20 MG tablet Take 20 mg by mouth daily.  2  . gabapentin (NEURONTIN) 300 MG capsule TAKE ONE CAPSULE BY MOUTH THREE TIMES DAILY. 90 capsule 0  . levothyroxine (SYNTHROID, LEVOTHROID) 75 MCG tablet TAKE ONE TABLET BY MOUTH DAILY BEFORE BREAKFAST. (Patient taking differently: Take 75 mcg by mouth daily before breakfast. ) 30 tablet 3  . PROAIR HFA 108 (90 Base) MCG/ACT inhaler INHALE TWO PUFFS BY MOUTH EVERY 4 TO 6 HOURS AS NEEDED 8.5 g  2  . QUEtiapine (SEROQUEL) 400 MG tablet TAKE 1/2 TABLET BY MOUTH AT BEDTIME. (Patient taking differently: Take 200 mg by mouth at bedtime. ) 15 tablet 3  . traZODone (DESYREL) 150 MG tablet Take 150 mg by mouth at bedtime.   2   No current facility-administered medications for this visit.     Allergies as of 08/30/2018  . (No Known Allergies)    Family History  Problem Relation Age of Onset  . Lung cancer Mother   . Heart disease Father   . Hypertension Sister   . Diabetes Maternal Grandmother   . Stroke Maternal Grandmother   . Colon cancer Neg Hx      Social History   Socioeconomic History  . Marital status: Divorced    Spouse name: Not on file  . Number of children: Not on file  . Years of education: Not on file  . Highest education level: Not on file  Occupational History  . Not on file  Social Needs  . Financial resource strain: Not on file  . Food insecurity:    Worry: Not on file    Inability: Not on file  . Transportation needs:    Medical: Not on file    Non-medical: Not on file  Tobacco Use  . Smoking status: Current Every Day Smoker    Packs/day: 1.00    Years: 45.00    Pack years: 45.00    Types: Cigarettes  . Smokeless tobacco: Never Used  Substance and Sexual Activity  . Alcohol use: Not Currently  . Drug use: Not Currently    Types: Cocaine, Marijuana, Methamphetamines    Comment: last used 4 months ago- sts 03/23/2018.  Marland Kitchen Sexual activity: Not Currently    Birth control/protection: Surgical  Lifestyle  . Physical activity:    Days per week: Not on file    Minutes per session: Not on file  . Stress: Not on file  Relationships  . Social connections:    Talks on phone: Not on file    Gets together: Not on file    Attends religious service: Not on file    Active member of club or organization: Not on file    Attends meetings of clubs or organizations: Not on file    Relationship status: Not on file  Other Topics Concern  . Not on file  Social History Narrative   Has two children. Divorced. Lives alone in [place]. Worked Architect. Smokes cigarettes. Eats all food groups.     Review of Systems: General: Negative for anorexia, weight loss, fever, chills, fatigue, weakness. Eyes: Negative for vision changes.  ENT: Negative for hoarseness, difficulty swallowing , nasal congestion. CV: Negative for chest pain, angina, palpitations, dyspnea on exertion, peripheral edema.  Respiratory: Negative for dyspnea at rest, dyspnea on exertion, cough, sputum, wheezing.  GI: See history of present  illness. GU:  Negative for dysuria, hematuria, urinary incontinence, urinary frequency, nocturnal urination.  MS: Negative for joint pain, low back pain.  Derm: Negative for rash or itching.  Neuro: Negative for weakness, abnormal sensation, seizure, frequent headaches, memory loss, confusion.  Psych: Negative for anxiety, depression, suicidal ideation, hallucinations.  Endo: Negative for unusual weight change.  Heme: Negative for bruising or bleeding. Allergy: Negative for rash or hives.   Physical Exam: There were no vitals taken for this visit. General:   Alert and oriented. Pleasant and cooperative. Well-nourished and well-developed.  Head:  Normocephalic and atraumatic. Eyes:  Without icterus, sclera clear and conjunctiva  pink.  Ears:  Normal auditory acuity. Mouth:  No deformity or lesions, oral mucosa pink.  Throat/Neck:  Supple, without mass or thyromegaly. Cardiovascular:  S1, S2 present without murmurs appreciated. Normal pulses noted. Extremities without clubbing or edema. Respiratory:  Clear to auscultation bilaterally. No wheezes, rales, or rhonchi. No distress.  Gastrointestinal:  +BS, soft, non-tender and non-distended. No HSM noted. No guarding or rebound. No masses appreciated.  Rectal:  Deferred  Musculoskalatal:  Symmetrical without gross deformities. Normal posture. Skin:  Intact without significant lesions or rashes. Neurologic:  Alert and oriented x4;  grossly normal neurologically. Psych:  Alert and cooperative. Normal mood and affect. Heme/Lymph/Immune: No significant cervical adenopathy. No excessive bruising noted.    08/30/2018 8:45 AM   Disclaimer: This note was dictated with voice recognition software. Similar sounding words can inadvertently be transcribed and may not be corrected upon review.

## 2018-08-30 NOTE — Telephone Encounter (Signed)
PATIENT WAS A NO SHOW AND LETTER SENT  °

## 2019-01-12 ENCOUNTER — Other Ambulatory Visit: Payer: Self-pay

## 2019-01-12 ENCOUNTER — Encounter (HOSPITAL_COMMUNITY): Payer: Self-pay | Admitting: Emergency Medicine

## 2019-01-12 DIAGNOSIS — Z79899 Other long term (current) drug therapy: Secondary | ICD-10-CM | POA: Insufficient documentation

## 2019-01-12 DIAGNOSIS — F17210 Nicotine dependence, cigarettes, uncomplicated: Secondary | ICD-10-CM | POA: Insufficient documentation

## 2019-01-12 DIAGNOSIS — M722 Plantar fascial fibromatosis: Secondary | ICD-10-CM | POA: Diagnosis not present

## 2019-01-12 DIAGNOSIS — Z85828 Personal history of other malignant neoplasm of skin: Secondary | ICD-10-CM | POA: Diagnosis not present

## 2019-01-12 DIAGNOSIS — I10 Essential (primary) hypertension: Secondary | ICD-10-CM | POA: Diagnosis not present

## 2019-01-12 DIAGNOSIS — M79671 Pain in right foot: Secondary | ICD-10-CM | POA: Diagnosis present

## 2019-01-12 DIAGNOSIS — E039 Hypothyroidism, unspecified: Secondary | ICD-10-CM | POA: Insufficient documentation

## 2019-01-12 NOTE — ED Triage Notes (Signed)
Pt c/o lower right leg, ankle and foot swelling x 6 mos, pt reports she has seen her PCP for it, has tried alternating heat and ice with minimal relief, pt reports problem has gotten today

## 2019-01-13 ENCOUNTER — Emergency Department (HOSPITAL_COMMUNITY)
Admission: EM | Admit: 2019-01-13 | Discharge: 2019-01-13 | Disposition: A | Discharge status: Home / Self Care | Payer: Medicaid Other | Attending: Emergency Medicine | Admitting: Emergency Medicine

## 2019-01-13 DIAGNOSIS — M722 Plantar fascial fibromatosis: Secondary | ICD-10-CM

## 2019-01-13 NOTE — ED Provider Notes (Signed)
[facility] EMERGENCY DEPARTMENT Provider Note   CSN: [id number] Arrival date & time: 01/12/19  2307  Time seen 6:18 AM  History   Chief Complaint Chief Complaint  Patient presents with  . Leg Pain    HPI Monica [name] is a 63 y.o. female.     HPI patient states she has had pain in her right foot for the past 6 months.  She states she is having to walk on the ball of her foot because she has pain in her heel and in her posterior heel and calf area.  She states she has been limping for 6 months.  She states yesterday the pain got more constant and she thought she had more swelling of her legs.  She states the pain is worse if she does not exercise.  She states doing water exercises in the pool or walking will make it feel better.  She denies any chest pain or shortness of breath.  She denies any history of DVT.  She denies being at bedrest more recent traveling.  She denies any family history of DVT.  PCP Dr Woody Seller   Past Medical History:  Diagnosis Date  . Anxiety   . Asthma   . Hepatitis C   . Hepatitis C   . History of drug abuse ([place])   . Hypertension   . Hypothyroidism   . Thyroid disease     Patient Active Problem List   Diagnosis Date Noted  . Constipation 03/08/2018  . Rectal bleeding 03/08/2018  . Chronic hepatitis C ([place]) 03/08/2018  . Cocaine abuse with cocaine-induced mood disorder ([place]) 08/18/2017  . 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 Restless legs 06/30/2017  . Basal cell carcinoma of skin of scalp and neck 06/30/2017  . Hypothyroidism, unspecified 06/30/2017  . Acute bronchitis, unspecified 06/30/2017    Past Surgical History:  Procedure Laterality Date  . COLONOSCOPY WITH PROPOFOL N/A 03/26/2018   Procedure: COLONOSCOPY WITH PROPOFOL;  Surgeon: Daneil Dolin, MD;  [facility];  Service: Endoscopy;  Laterality: N/A;  11:15am  . POLYPECTOMY  03/26/2018   Procedure: POLYPECTOMY;  Surgeon: Daneil Dolin, MD;  [facility];  Service: Endoscopy;;  splenic flexure  . TUBAL LIGATION       OB History   No obstetric history on file.      Home Medications    Prior to Admission medications   Medication Sig Start Date End Date Taking? Authorizing Provider  Ascorbic Acid (VITAMIN C PO) Take 1 tablet by mouth daily as needed (immune support).    [provider]  Aspirin-Acetaminophen (GOODYS BODY PAIN PO) Take 1 packet by mouth daily as needed (pain).    [provider]  busPIRone (BUSPAR) 30 MG tablet TAKE ONE TABLET BY MOUTH TWICE DAILY. 03/01/18   Caren Macadam, MD  CATAPRES-TTS-2 0.2 MG/24HR patch APPLY ONE PATCH ONTO THE SKIN ONCE A WEEK. 04/03/18   Caren Macadam, MD  cholecalciferol (VITAMIN D) 1000 units tablet Take 1,000 Units by mouth daily.    [provider]  citalopram (CELEXA) 20 MG tablet Take 20 mg by mouth daily. 02/21/18   [provider]  gabapentin (NEURONTIN) 300 MG capsule TAKE ONE CAPSULE BY MOUTH THREE TIMES DAILY. 03/16/18   Fayrene Helper, MD  levothyroxine (SYNTHROID, LEVOTHROID) 75 MCG tablet TAKE ONE TABLET BY MOUTH DAILY BEFORE BREAKFAST. Patient taking differently: Take 75 mcg by mouth daily before breakfast.  03/01/18   Caren Macadam,  MD  PROAIR HFA 108 (90 Base) MCG/ACT inhaler INHALE TWO PUFFS BY MOUTH EVERY 4 TO 6 HOURS AS NEEDED 03/30/18   Caren Macadam, MD  QUEtiapine (SEROQUEL) 400 MG tablet TAKE 1/2 TABLET BY MOUTH AT BEDTIME. Patient taking differently: Take 200 mg by mouth at bedtime.  03/01/18   Caren Macadam, MD  traZODone (DESYREL) 150 MG tablet Take 150 mg by mouth at bedtime.  02/21/18   [provider]    Family History Family History  Problem Relation Age of Onset  . Lung cancer Mother   . Heart disease Father   . Hypertension Sister   . Diabetes Maternal Grandmother   . Stroke Maternal Grandmother   . Colon cancer Neg Hx     Social History Social History   Tobacco Use  . Smoking status: Current Every  Day Smoker    Packs/day: 1.00    Years: 45.00    Pack years: 45.00    Types: Cigarettes  . Smokeless tobacco: Never Used  Substance Use Topics  . Alcohol use: Not Currently  . Drug use: Not Currently    Types: Cocaine, Marijuana, Methamphetamines    Comment: last used 4 months ago- sts 03/23/2018.  lives alone   Allergies   Patient has no known allergies.   Review of Systems Review of Systems  All other systems reviewed and are negative.    Physical Exam Updated Vital Signs BP (!) 142/68   Pulse 82   Temp 98.5 F (36.9 C) (Oral)   Resp 16   Ht 5\' 1"  (1.549 m)   Wt 52.2 kg   SpO2 100%   BMI 21.73 kg/m   Physical Exam Vitals signs and nursing note reviewed.  Constitutional:      General: She is not in acute distress.    Appearance: Normal appearance.  HENT:     Head: Normocephalic and atraumatic.     Right Ear: External ear normal.     Left Ear: External ear normal.     Nose: Nose normal.  Eyes:     Extraocular Movements: Extraocular movements intact.     Conjunctiva/sclera: Conjunctivae normal.  Neck:     Musculoskeletal: Normal range of motion.  Cardiovascular:     Rate and Rhythm: Normal rate.  Pulmonary:     Effort: Pulmonary effort is normal. No respiratory distress.  Musculoskeletal:     Comments: On exam of her right foot she is nontender over the MTPs of her foot.  She is tender however over the heel and especially over the Achilles tendon and the posterior heel.  She also has some tenderness going up into her posterior calf.  There is no cords felt or masses.  There may be mild swelling of her right lower leg compared to the left.  There is no pitting edema.  She has good range of motion of her foot at the ankle.  Skin:    General: Skin is warm and dry.     Findings: No erythema.  Neurological:     General: No focal deficit present.     Mental Status: She is alert and oriented to person, place, and time.     Cranial Nerves: No cranial nerve  deficit.  Psychiatric:        Mood and Affect: Mood normal.        Behavior: Behavior normal.        Thought Content: Thought content normal.      ED Treatments / Results  Labs (  all labs ordered are listed, but only abnormal results are displayed) Labs Reviewed - No data to display  EKG None  Radiology No results found.  Procedures Procedures (including critical care time)  Medications Ordered in ED Medications - No data to display   Initial Impression / Assessment and Plan / ED Course  I have reviewed the triage vital signs and the nursing notes.  Pertinent labs & imaging results that were available during my care of the patient were reviewed by me and considered in my medical decision making (see chart for details).       I discussed doing a d-dimer or do an ultrasound to check her for DVT however patient does not want to have that done.  I discussed with her I felt the most likely etiology of her discomfort was plantar fasciitis.  We discussed getting gel pads for her shoes to wear in the heels and she can take an anti-inflammatory over-the-counter for pain.  She wants a referral to an orthopedist and she was given one  Final Clinical Impressions(s) / ED Diagnoses   Final diagnoses:  Plantar fasciitis    ED Discharge Orders    None    OTC aleve  Plan discharge  Rolland Porter, MD, Barbette Or, MD 01/13/19 [phone]

## 2019-01-13 NOTE — Discharge Instructions (Addendum)
You need to get the gel heel pads to put in your shoes.  Please wear shoes with good supports, do not wear flip-flops.  Take 1 Aleve twice a day for pain.  Please follow-up with an orthopedist about your pain.  If you have an orthopedist you can see them or you can see Dr. Aline Brochure the orthopedist on call.

## 2019-01-16 ENCOUNTER — Telehealth: Payer: Self-pay | Admitting: Orthopedic Surgery

## 2019-01-16 NOTE — Telephone Encounter (Addendum)
Monica [name] called stating she went to ED for achilles tendon pain.  She said they suggested that she go orthopedics.  I told her that she would need to get her Medicaid straightened out and then call us back.  She said she would do this

## 2019-11-15 ENCOUNTER — Ambulatory Visit (INDEPENDENT_AMBULATORY_CARE_PROVIDER_SITE_OTHER): Payer: Medicaid Other | Admitting: Podiatry

## 2019-11-15 DIAGNOSIS — Z5329 Procedure and treatment not carried out because of patient's decision for other reasons: Secondary | ICD-10-CM

## 2019-11-15 NOTE — Progress Notes (Signed)
No show for appt. 

## 2019-12-19 ENCOUNTER — Other Ambulatory Visit: Payer: Self-pay

## 2019-12-19 ENCOUNTER — Other Ambulatory Visit: Payer: Self-pay | Admitting: Podiatry

## 2019-12-19 ENCOUNTER — Ambulatory Visit: Payer: Medicaid Other | Admitting: Podiatry

## 2019-12-19 ENCOUNTER — Ambulatory Visit (INDEPENDENT_AMBULATORY_CARE_PROVIDER_SITE_OTHER): Payer: Medicaid Other

## 2019-12-19 VITALS — Temp 97.0°F

## 2019-12-19 DIAGNOSIS — M7661 Achilles tendinitis, right leg: Secondary | ICD-10-CM

## 2019-12-19 DIAGNOSIS — M24173 Other articular cartilage disorders, unspecified ankle: Secondary | ICD-10-CM

## 2019-12-19 DIAGNOSIS — M779 Enthesopathy, unspecified: Secondary | ICD-10-CM

## 2019-12-19 DIAGNOSIS — M216X9 Other acquired deformities of unspecified foot: Secondary | ICD-10-CM

## 2019-12-19 DIAGNOSIS — M24176 Other articular cartilage disorders, unspecified foot: Secondary | ICD-10-CM

## 2019-12-19 DIAGNOSIS — M7662 Achilles tendinitis, left leg: Secondary | ICD-10-CM | POA: Diagnosis not present

## 2019-12-19 NOTE — Progress Notes (Signed)
  Subjective:  Patient ID: Monica [name], female    DOB: 1955/09/10,  MRN: [id number]  Chief Complaint  Patient presents with  . Foot Pain    Bilateral, back of heels. x3-12 months. Pt stated, "I fell 1 year ago and my R foot started hurting. I was diagnosed with an achilles heel at the hospital. 8-10/10 pain almost every day. The ankle swells, and my leg shakes. The L heel is also having pain now x3 months".   64 y.o. female presents with the above complaint. History confirmed with patient.   Objective:  Physical Exam: warm, good capillary refill, no trophic changes or ulcerative lesions, normal DP and PT pulses and normal sensory exam. Left Foot: normal exam, no swelling, tenderness, instability; ligaments intact, full range of motion of all ankle/foot joints  Right Foot: decreased ankle joint ROM - only able to get to neutral despite maximal effort. Good extensor strength. Osseous blockade. No soft tissue equinus restriction. POP right Achilles tendon  No images are attached to the encounter.  Radiographs: X-ray of the right foot: osseous blockade anterior ankle right, no acute fractures or dislocation Assessment:   1. Joint derangement of ankle or foot   2. Equinus deformity of foot   3. Achilles tendinitis of right lower extremity    Plan:  Patient was evaluated and treated and all questions answered.  Osseous Blockade Right Ankle, Achilles Tendonitis -XR reviewed. Osseous prominence anterior ankle noted -Order MRI for further eval. Eval for loose bodies. -She may benefit from surgical eval at later date -Hold off further anti-inflammatory injection at this time.  No follow-ups on file.

## 2019-12-20 ENCOUNTER — Telehealth: Payer: Self-pay | Admitting: *Deleted

## 2019-12-20 DIAGNOSIS — M7661 Achilles tendinitis, right leg: Secondary | ICD-10-CM

## 2019-12-20 DIAGNOSIS — M24176 Other articular cartilage disorders, unspecified foot: Secondary | ICD-10-CM

## 2019-12-20 DIAGNOSIS — M216X9 Other acquired deformities of unspecified foot: Secondary | ICD-10-CM

## 2019-12-20 NOTE — Telephone Encounter (Signed)
[employer] - Medicaid requires clinicals for review to pre-cert 37628 right ankle without contrast, Case:  [id number]. Faxed clinicals to [facility].

## 2019-12-20 NOTE — Telephone Encounter (Signed)
-----   Message from Evelina Bucy, DPM sent at 12/19/2019  5:41 PM EDT ----- Can we order MRI right ankle

## 2019-12-24 NOTE — Telephone Encounter (Signed)
[place]:  [id number] FOR MRI [id number] RIGHT ANKLE, VALID: 12/20/2019, EXPIRES: 06/17/2020. FAXED TO [facility].

## 2020-01-02 ENCOUNTER — Inpatient Hospital Stay: Admission: RE | Admit: 2020-01-02 | Payer: Medicaid Other | Source: Ambulatory Visit

## 2020-01-02 ENCOUNTER — Telehealth: Payer: Self-pay | Admitting: Podiatry

## 2020-01-02 DIAGNOSIS — M24176 Other articular cartilage disorders, unspecified foot: Secondary | ICD-10-CM

## 2020-01-02 DIAGNOSIS — M7661 Achilles tendinitis, right leg: Secondary | ICD-10-CM

## 2020-01-02 DIAGNOSIS — M216X9 Other acquired deformities of unspecified foot: Secondary | ICD-10-CM

## 2020-01-02 NOTE — Telephone Encounter (Signed)
Pt called and would like to have the location of her MRI changed to [facility].  Please give patient a call.

## 2020-01-03 NOTE — Addendum Note (Signed)
Addended by: Harriett Sine D on: 01/03/2020 08:45 AM   Modules accepted: Orders

## 2020-01-03 NOTE — Telephone Encounter (Signed)
Left message informing pt of change of location for MRI to [facility].

## 2020-01-03 NOTE — Telephone Encounter (Signed)
I SPOKE WITH [facility] - MEDICAID-[facility] AND SHE CHANGED MRI [id number] RIGHT ANKLE LOCATION TO [facility] NPI: [id number] IN [place]:  [id number], VALID: 12/20/2019 -06/17/2020. FAXED [place].

## 2020-01-14 ENCOUNTER — Other Ambulatory Visit (HOSPITAL_COMMUNITY): Payer: Self-pay | Admitting: Radiology

## 2020-01-14 ENCOUNTER — Other Ambulatory Visit (HOSPITAL_COMMUNITY): Payer: Self-pay

## 2020-01-14 ENCOUNTER — Other Ambulatory Visit (HOSPITAL_COMMUNITY): Payer: Self-pay | Admitting: Student

## 2020-01-14 DIAGNOSIS — R0602 Shortness of breath: Secondary | ICD-10-CM

## 2020-01-15 ENCOUNTER — Telehealth: Payer: Self-pay | Admitting: Podiatry

## 2020-01-15 NOTE — Telephone Encounter (Signed)
Pt calling stating that she is having a lot of pain and would like to know if she could get muscle relaxers please assist

## 2020-01-16 MED ORDER — CYCLOBENZAPRINE HCL 5 MG PO TABS
5.0000 mg | ORAL_TABLET | Freq: Three times a day (TID) | ORAL | 0 refills | Status: DC | PRN
Start: 2020-01-16 — End: 2020-08-12

## 2020-01-16 NOTE — Addendum Note (Signed)
Addended by: Hardie Pulley on: 01/16/2020 04:51 PM   Modules accepted: Orders

## 2020-01-16 NOTE — Telephone Encounter (Signed)
Rx flexeril

## 2020-01-23 ENCOUNTER — Other Ambulatory Visit: Payer: Self-pay

## 2020-01-23 ENCOUNTER — Ambulatory Visit: Payer: Medicaid Other | Admitting: Podiatry

## 2020-01-23 DIAGNOSIS — M216X9 Other acquired deformities of unspecified foot: Secondary | ICD-10-CM

## 2020-01-23 DIAGNOSIS — M7661 Achilles tendinitis, right leg: Secondary | ICD-10-CM | POA: Diagnosis not present

## 2020-01-23 DIAGNOSIS — M249 Joint derangement, unspecified: Secondary | ICD-10-CM | POA: Diagnosis not present

## 2020-01-23 DIAGNOSIS — M24173 Other articular cartilage disorders, unspecified ankle: Secondary | ICD-10-CM

## 2020-01-23 NOTE — Progress Notes (Signed)
  Subjective:  Patient ID: Monica [name], female    DOB: 19-Apr-1956,  MRN: [id number]  Chief Complaint  Patient presents with  . Ankle Pain    Pt states her ankle pain is very bad, wants to discuss fluid pills for swelling and muscle relaxers for her ankle.   64 y.o. female presents with the above complaint. History confirmed with patient.   Objective:  Physical Exam: warm, good capillary refill, no trophic changes or ulcerative lesions, normal DP and PT pulses and normal sensory exam. Left Foot: normal exam, no swelling, tenderness, instability; ligaments intact, full range of motion of all ankle/foot joints  Right Foot: decreased ankle joint ROM - only able to get to neutral despite maximal effort. Good extensor strength. Osseous blockade. No soft tissue equinus restriction. POP right Achilles tendon Assessment:   1. Joint derangement of ankle or foot   2. Equinus deformity of foot   3. Achilles tendinitis of right lower extremity    Plan:  Patient was evaluated and treated and all questions answered.  Osseous Blockade Right Ankle, Achilles Tendonitis -Pending MRI for further eval. Eval for loose bodies. -She may benefit from surgical eval at later date -Continue Flexeril PRN -Hold off further anti-inflammatory injection at this time.  Return in about 3 weeks (around 02/13/2020) for MRI review.

## 2020-02-05 ENCOUNTER — Ambulatory Visit (HOSPITAL_COMMUNITY): Payer: Medicaid Other

## 2020-02-17 ENCOUNTER — Telehealth: Payer: Self-pay | Admitting: Podiatry

## 2020-02-17 NOTE — Telephone Encounter (Signed)
Called pt let her know to keep her appt to have her reults read to her to discuss what to do next with the provider

## 2020-02-17 NOTE — Telephone Encounter (Signed)
Thanks yes we will review her MRI after she has it done

## 2020-02-18 ENCOUNTER — Ambulatory Visit: Payer: Medicaid Other | Admitting: Podiatry

## 2020-02-24 ENCOUNTER — Ambulatory Visit (HOSPITAL_COMMUNITY): Admission: RE | Admit: 2020-02-24 | Payer: Medicaid Other | Source: Ambulatory Visit

## 2020-02-27 ENCOUNTER — Ambulatory Visit (HOSPITAL_COMMUNITY): Payer: Medicaid Other

## 2020-02-28 ENCOUNTER — Other Ambulatory Visit: Payer: Self-pay

## 2020-02-28 ENCOUNTER — Ambulatory Visit (HOSPITAL_COMMUNITY)
Admission: RE | Admit: 2020-02-28 | Discharge: 2020-02-28 | Disposition: A | Discharge status: Home / Self Care | Payer: Medicaid Other | Source: Ambulatory Visit | Attending: Podiatry | Admitting: Podiatry

## 2020-02-28 DIAGNOSIS — M216X9 Other acquired deformities of unspecified foot: Secondary | ICD-10-CM | POA: Insufficient documentation

## 2020-02-28 DIAGNOSIS — M249 Joint derangement, unspecified: Secondary | ICD-10-CM | POA: Diagnosis present

## 2020-02-28 DIAGNOSIS — M7661 Achilles tendinitis, right leg: Secondary | ICD-10-CM | POA: Diagnosis present

## 2020-03-03 ENCOUNTER — Ambulatory Visit: Payer: Medicaid Other | Admitting: Podiatry

## 2020-03-06 ENCOUNTER — Other Ambulatory Visit (HOSPITAL_COMMUNITY)
Admission: RE | Admit: 2020-03-06 | Discharge: 2020-03-06 | Disposition: A | Discharge status: Home / Self Care | Payer: Medicaid Other | Source: Ambulatory Visit | Attending: Internal Medicine | Admitting: Internal Medicine

## 2020-03-06 ENCOUNTER — Ambulatory Visit: Payer: Medicaid Other | Admitting: Podiatry

## 2020-03-06 NOTE — Progress Notes (Signed)
Pt. Missed her Covid test appointment this morning. I called her and had to leave her a vm. Let pt. Know she could still have her test done if she could make it by 4:30 today. I didn't hear from her so I called back and left another message. Asking her to please call me to set up an appt. On Monday so she she can still have her PFT done on Tuesday. Hoping she'll call Monday. Leaving encounter open.

## 2020-03-10 ENCOUNTER — Ambulatory Visit (HOSPITAL_COMMUNITY): Admission: RE | Admit: 2020-03-10 | Payer: Medicaid Other | Source: Ambulatory Visit

## 2020-03-17 ENCOUNTER — Ambulatory Visit: Payer: Medicaid Other | Admitting: Podiatry

## 2020-04-03 ENCOUNTER — Other Ambulatory Visit: Payer: Self-pay

## 2020-04-03 ENCOUNTER — Ambulatory Visit (INDEPENDENT_AMBULATORY_CARE_PROVIDER_SITE_OTHER): Payer: Medicaid Other | Admitting: Podiatry

## 2020-04-03 DIAGNOSIS — R253 Fasciculation: Secondary | ICD-10-CM | POA: Diagnosis not present

## 2020-04-03 DIAGNOSIS — M21371 Foot drop, right foot: Secondary | ICD-10-CM | POA: Diagnosis not present

## 2020-04-03 MED ORDER — METHOCARBAMOL 500 MG PO TABS: 500.0000 mg | ORAL_TABLET | Freq: Three times a day (TID) | ORAL | 0 refills | Status: DC

## 2020-04-03 NOTE — Progress Notes (Signed)
  Subjective:  Patient ID: Monica [name], female    DOB: 1956/02/07,  MRN: [id number]  Chief Complaint  Patient presents with  . Foot Pain    Pt states painful muscle spasms.   64 y.o. female presents with the above complaint. History confirmed with patient.  Reports worsening muscle spasms and inability to raise her foot.  Objective:  Physical Exam: warm, good capillary refill, no trophic changes or ulcerative lesions, normal DP and PT pulses and normal sensory exam. Left Foot: normal exam, no swelling, tenderness, instability; ligaments intact, full range of motion of all ankle/foot joints  Right Foot: decreased ankle joint ROM - only able to get to neutral despite maximal effort. Good extensor strength.  . No soft tissue equinus restriction. POP right Achilles tendon unable to fully reach plantigrade foot due to contracture Assessment:   1. Muscle fasciculation   2. Right foot drop    Plan:  Patient was evaluated and treated and all questions answered.  Osseous Blockade Right Ankle, Achilles Tendonitis -Pending MRI for further eval. Eval for loose bodies. -She may benefit from surgical eval at later date -Switch to Robaxin -I think she would benefit from an evaluation by neurology. Her issue appears markedly worse since last visit, now without good extensor strength. She has a small resting fasciculation at the EDB.  -At this point I think the issue is more neurologic than orthopedic.  Will refer to neurology for eval for possible muscle spasticity  No follow-ups on file.

## 2020-04-09 ENCOUNTER — Telehealth: Payer: Self-pay

## 2020-04-09 NOTE — Telephone Encounter (Signed)
Pt called about to medication question. Please advise

## 2020-04-14 ENCOUNTER — Other Ambulatory Visit: Payer: Self-pay | Admitting: Podiatry

## 2020-04-14 DIAGNOSIS — R253 Fasciculation: Secondary | ICD-10-CM

## 2020-04-14 NOTE — Progress Notes (Signed)
Referral sent to Dr. Merlene Laughter per pt request

## 2020-08-12 ENCOUNTER — Telehealth: Payer: Self-pay | Admitting: *Deleted

## 2020-08-12 MED ORDER — CYCLOBENZAPRINE HCL 5 MG PO TABS: 5.0000 mg | ORAL_TABLET | Freq: Three times a day (TID) | ORAL | 0 refills | Status: DC | PRN

## 2020-08-12 NOTE — Telephone Encounter (Signed)
Called patient and informed per Dr March Rummage that the prescription has been sent to pharmacy on file and that she needs to schedule f/u appt. before additional refills, verbalized understanding.

## 2020-08-12 NOTE — Telephone Encounter (Signed)
Patient is calling to request for refill on cyclobenzaprine-5mg  since the other muscle relaxers are not helping, has discontinued Methocarbamol, currently 3 bottles on hand.Please call and send to pharmacy on file.

## 2020-10-12 ENCOUNTER — Other Ambulatory Visit (HOSPITAL_COMMUNITY): Payer: Self-pay | Admitting: Neurology

## 2020-10-12 DIAGNOSIS — G959 Disease of spinal cord, unspecified: Secondary | ICD-10-CM

## 2020-10-28 ENCOUNTER — Other Ambulatory Visit: Payer: Self-pay

## 2020-10-28 ENCOUNTER — Ambulatory Visit (HOSPITAL_COMMUNITY)
Admission: RE | Admit: 2020-10-28 | Discharge: 2020-10-28 | Disposition: A | Discharge status: Home / Self Care | Payer: Medicaid Other | Source: Ambulatory Visit | Attending: Neurology | Admitting: Neurology

## 2020-10-28 DIAGNOSIS — G959 Disease of spinal cord, unspecified: Secondary | ICD-10-CM | POA: Insufficient documentation

## 2021-02-19 ENCOUNTER — Other Ambulatory Visit: Payer: Self-pay | Admitting: Neurological Surgery

## 2021-02-19 ENCOUNTER — Other Ambulatory Visit (HOSPITAL_COMMUNITY): Payer: Self-pay | Admitting: Neurological Surgery

## 2021-02-19 DIAGNOSIS — G959 Disease of spinal cord, unspecified: Secondary | ICD-10-CM

## 2021-03-02 ENCOUNTER — Ambulatory Visit (HOSPITAL_COMMUNITY)
Admission: RE | Admit: 2021-03-02 | Discharge: 2021-03-02 | Disposition: A | Discharge status: Home / Self Care | Payer: Medicaid Other | Source: Ambulatory Visit | Attending: Neurological Surgery | Admitting: Neurological Surgery

## 2021-03-02 ENCOUNTER — Other Ambulatory Visit: Payer: Self-pay

## 2021-03-02 DIAGNOSIS — G959 Disease of spinal cord, unspecified: Secondary | ICD-10-CM | POA: Diagnosis present

## 2021-03-16 ENCOUNTER — Other Ambulatory Visit: Payer: Self-pay | Admitting: Neurological Surgery

## 2021-03-31 NOTE — Progress Notes (Signed)
Surgical Instructions    Your procedure is scheduled on Tuesday, September 27th, 2022.   Report to [facility] Main Entrance "A" at 10:00 A.M., then check in with the Admitting office.  Call this number if you have problems the morning of surgery:  [phone]   If you have any questions prior to your surgery date call [phone]: Open Monday-Friday 8am-4pm    Remember:  Do not eat after midnight the night before your surgery  You may drink clear liquids until 09:00 the morning of your surgery.   Clear liquids allowed are: Water, Non-Citrus Juices (without pulp), Carbonated Beverages, Clear Tea, Black Coffee ONLY (NO MILK, CREAM OR POWDERED CREAMER of any kind), and Gatorade    Take these medicines the morning of surgery with A SIP OF WATER:  gabapentin (NEURONTIN)  levothyroxine (SYNTHROID, LEVOTHROID) metoprolol succinate (TOPROL-XL)  If needed:  HYDROcodone-acetaminophen (NORCO)  methocarbamol (ROBAXIN)  PROAIR HFA - please, bring the inhaler with you the day of surgery  As of today, STOP taking any Aspirin (unless otherwise instructed by your surgeon) Aleve, Naproxen, Ibuprofen, Motrin, Advil, Goody's, BC's, all herbal medications, fish oil, and all vitamins.           Do not wear jewelry or makeup Do not wear lotions, powders, perfumes, or deodorant. Do not shave 48 hours prior to surgery.   Do not bring valuables to the hospital. DO Not wear nail polish, gel polish, artificial nails, or any other type of covering on natural nails including finger and toenails. If patients have artificial nails, gel coating, etc. that need to be removed by a nail salon please have this removed prior to surgery or surgery may need to be canceled/delayed if the surgeon/ anesthesia feels like the patient is unable to be adequately monitored.              [place] is not responsible for any belongings or valuables.  Do NOT Smoke (Tobacco/Vaping)  24 hours prior to your procedure If  you use a CPAP at night, you may bring your mask for your overnight stay.   Contacts, glasses, dentures or bridgework may not be worn into surgery, please bring cases for these belongings   For patients admitted to the hospital, discharge time will be determined by your treatment team.   Patients discharged the day of surgery will not be allowed to drive home, and someone needs to stay with them for 24 hours.  NO VISITORS WILL BE ALLOWED IN PRE-OP WHERE PATIENTS GET READY FOR SURGERY.  ONLY 1 SUPPORT PERSON MAY BE PRESENT WHILE YOU ARE IN SURGERY.  IF YOU ARE TO BE ADMITTED, ONCE YOU ARE IN YOUR ROOM YOU WILL BE ALLOWED TWO (2) VISITORS.  Minor children may have two parents present. Special consideration for safety and communication needs will be reviewed on a case by case basis.  Special instructions:    Oral Hygiene is also important to reduce your risk of infection.  Remember - BRUSH YOUR TEETH THE MORNING OF SURGERY WITH YOUR REGULAR TOOTHPASTE   - Preparing For Surgery  Before surgery, you can play an important role. Because skin is not sterile, your skin needs to be as free of germs as possible. You can reduce the number of germs on your skin by washing with CHG (chlorahexidine gluconate) Soap before surgery.  CHG is an antiseptic cleaner which kills germs and bonds with the skin to continue killing germs even after washing.     Please do not  use if you have an allergy to CHG or antibacterial soaps. If your skin becomes reddened/irritated stop using the CHG.  Do not shave (including legs and underarms) for at least 48 hours prior to first CHG shower. It is OK to shave your face.  Please follow these instructions carefully.     Shower the NIGHT BEFORE SURGERY and the MORNING OF SURGERY with CHG Soap.   If you chose to wash your hair, wash your hair first as usual with your normal shampoo. After you shampoo, rinse your hair and body thoroughly to remove the shampoo.  Then  [employer] and genitals (private parts) with your normal soap and rinse thoroughly to remove soap.  After that Use CHG Soap as you would any other liquid soap. You can apply CHG directly to the skin and wash gently with a scrungie or a clean washcloth.   Apply the CHG Soap to your body ONLY FROM THE NECK DOWN.  Do not use on open wounds or open sores. Avoid contact with your eyes, ears, mouth and genitals (private parts). Wash Face and genitals (private parts)  with your normal soap.   Wash thoroughly, paying special attention to the area where your surgery will be performed.  Thoroughly rinse your body with warm water from the neck down.  DO NOT shower/wash with your normal soap after using and rinsing off the CHG Soap.  Pat yourself dry with a CLEAN TOWEL.  Wear CLEAN PAJAMAS to bed the night before surgery  Place CLEAN SHEETS on your bed the night before your surgery  DO NOT SLEEP WITH PETS.   Day of Surgery:  Take a shower with CHG soap. Wear Clean/Comfortable clothing the morning of surgery Do not apply any deodorants/lotions.   Remember to brush your teeth WITH YOUR REGULAR TOOTHPASTE.   Please read over the following fact sheets that you were given.

## 2021-04-01 ENCOUNTER — Inpatient Hospital Stay (HOSPITAL_COMMUNITY)
Admission: RE | Admit: 2021-04-01 | Discharge: 2021-04-01 | Disposition: A | Discharge status: Home / Self Care | Payer: Medicaid Other | Source: Ambulatory Visit

## 2021-04-01 ENCOUNTER — Other Ambulatory Visit (HOSPITAL_COMMUNITY): Payer: Medicaid Other

## 2021-04-29 NOTE — Progress Notes (Signed)
Surgical Instructions    Your procedure is scheduled on Tuesday, October 25th, 2022.   Report to [facility] Main Entrance "A" at 09:50 A.M., then check in with the Admitting office.  Call this number if you have problems the morning of surgery:  [phone]   If you have any questions prior to your surgery date call [phone]: Open Monday-Friday 8am-4pm    Remember:  Do not eat after midnight the night before your surgery  You may drink clear liquids until 08:50 the morning of your surgery.   Clear liquids allowed are: Water, Non-Citrus Juices (without pulp), Carbonated Beverages, Clear Tea, Black Coffee ONLY (NO MILK, CREAM OR POWDERED CREAMER of any kind), and Gatorade    Take these medicines the morning of surgery with A SIP OF WATER:  gabapentin (NEURONTIN) levothyroxine (SYNTHROID, LEVOTHROID) metoprolol succinate (TOPROL-XL)  If needed:   HYDROcodone-acetaminophen (NORCO)  methocarbamol (ROBAXIN) PROAIR HFA - please, bring the inhaler with you the day of surgery  As of today, STOP taking any Aspirin (unless otherwise instructed by your surgeon) Aleve, Naproxen, Ibuprofen, Motrin, Advil, Goody's, BC's, all herbal medications, fish oil, and all vitamins.   After your COVID test   You are not required to quarantine however you are required to wear a well-fitting mask when you are out and around people not in your household.  If your mask becomes wet or soiled, replace with a new one.  Wash your hands often with soap and water for 20 seconds or clean your hands with an alcohol-based hand sanitizer that contains at least 60% alcohol.  Do not share personal items.  Notify your provider: if you are in close contact with someone who has COVID  or if you develop a fever of 100.4 or greater, sneezing, cough, sore throat, shortness of breath or body aches.    The day of surgery  Do not wear jewelry or makeup Do not wear lotions, powders, perfumes, or deodorant. Do not  shave 48 hours prior to surgery.   Do not bring valuables to the hospital. DO Not wear nail polish, gel polish, artificial nails, or any other type of covering on natural nails including finger and toenails. If patients have artificial nails, gel coating, etc. that need to be removed by a nail salon, please have this removed prior to surgery or surgery may need to be canceled/delayed if the surgeon/ anesthesia feels like the patient is unable to be adequately monitored.              [place] is not responsible for any belongings or valuables.  Do NOT Smoke (Tobacco/Vaping)  24 hours prior to your procedure  If you use a CPAP at night, you may bring your mask for your overnight stay.   Contacts, glasses, hearing aids, dentures or partials may not be worn into surgery, please bring cases for these belongings   For patients admitted to the hospital, discharge time will be determined by your treatment team.   Patients discharged the day of surgery will not be allowed to drive home, and someone needs to stay with them for 24 hours.  NO VISITORS WILL BE ALLOWED IN PRE-OP WHERE PATIENTS ARE PREPPED FOR SURGERY.  ONLY 1 SUPPORT PERSON MAY BE PRESENT IN THE WAITING ROOM WHILE YOU ARE IN SURGERY.  IF YOU ARE TO BE ADMITTED, ONCE YOU ARE IN YOUR ROOM YOU WILL BE ALLOWED TWO (2) VISITORS. 1 (ONE) VISITOR MAY STAY OVERNIGHT BUT MUST ARRIVE TO THE ROOM BY 8pm.  Minor children may have two parents present. Special consideration for safety and communication needs will be reviewed on a case by case basis.  Special instructions:    Oral Hygiene is also important to reduce your risk of infection.  Remember - BRUSH YOUR TEETH THE MORNING OF SURGERY WITH YOUR REGULAR TOOTHPASTE   [facility]- Preparing For Surgery  Before surgery, you can play an important role. Because skin is not sterile, your skin needs to be as free of germs as possible. You can reduce the number of germs on your skin by washing with  CHG (chlorahexidine gluconate) Soap before surgery.  CHG is an antiseptic cleaner which kills germs and bonds with the skin to continue killing germs even after washing.     Please do not use if you have an allergy to CHG or antibacterial soaps. If your skin becomes reddened/irritated stop using the CHG.  Do not shave (including legs and underarms) for at least 48 hours prior to first CHG shower. It is OK to shave your face.  Please follow these instructions carefully.     Shower the NIGHT BEFORE SURGERY and the MORNING OF SURGERY with CHG Soap.   If you chose to wash your hair, wash your hair first as usual with your normal shampoo. After you shampoo, rinse your hair and body thoroughly to remove the shampoo.  Then [employer] and genitals (private parts) with your normal soap and rinse thoroughly to remove soap.  After that Use CHG Soap as you would any other liquid soap. You can apply CHG directly to the skin and wash gently with a scrungie or a clean washcloth.   Apply the CHG Soap to your body ONLY FROM THE NECK DOWN.  Do not use on open wounds or open sores. Avoid contact with your eyes, ears, mouth and genitals (private parts). Wash Face and genitals (private parts)  with your normal soap.   Wash thoroughly, paying special attention to the area where your surgery will be performed.  Thoroughly rinse your body with warm water from the neck down.  DO NOT shower/wash with your normal soap after using and rinsing off the CHG Soap.  Pat yourself dry with a CLEAN TOWEL.  Wear CLEAN PAJAMAS to bed the night before surgery  Place CLEAN SHEETS on your bed the night before your surgery  DO NOT SLEEP WITH PETS.   Day of Surgery:  Take a shower with CHG soap. Wear Clean/Comfortable clothing the morning of surgery Do not apply any deodorants/lotions.   Remember to brush your teeth WITH YOUR REGULAR TOOTHPASTE.   Please read over the following fact sheets that you were given.

## 2021-04-30 ENCOUNTER — Encounter (HOSPITAL_COMMUNITY): Payer: Self-pay

## 2021-04-30 ENCOUNTER — Other Ambulatory Visit: Payer: Self-pay

## 2021-04-30 ENCOUNTER — Encounter (HOSPITAL_COMMUNITY)
Admission: RE | Admit: 2021-04-30 | Discharge: 2021-04-30 | Disposition: A | Discharge status: Home / Self Care | Payer: Medicaid Other | Source: Ambulatory Visit | Attending: Neurological Surgery | Admitting: Neurological Surgery

## 2021-04-30 VITALS — BP 140/61 | HR 97 | Temp 98.5°F | Resp 17

## 2021-04-30 DIAGNOSIS — Z01818 Encounter for other preprocedural examination: Secondary | ICD-10-CM

## 2021-04-30 DIAGNOSIS — K625 Hemorrhage of anus and rectum: Secondary | ICD-10-CM | POA: Insufficient documentation

## 2021-04-30 DIAGNOSIS — B182 Chronic viral hepatitis C: Secondary | ICD-10-CM | POA: Diagnosis not present

## 2021-04-30 DIAGNOSIS — Z7901 Long term (current) use of anticoagulants: Secondary | ICD-10-CM | POA: Insufficient documentation

## 2021-04-30 DIAGNOSIS — F17210 Nicotine dependence, cigarettes, uncomplicated: Secondary | ICD-10-CM | POA: Diagnosis not present

## 2021-04-30 DIAGNOSIS — E039 Hypothyroidism, unspecified: Secondary | ICD-10-CM | POA: Insufficient documentation

## 2021-04-30 DIAGNOSIS — Z79899 Other long term (current) drug therapy: Secondary | ICD-10-CM | POA: Insufficient documentation

## 2021-04-30 DIAGNOSIS — C4441 Basal cell carcinoma of skin of scalp and neck: Secondary | ICD-10-CM | POA: Diagnosis not present

## 2021-04-30 DIAGNOSIS — I119 Hypertensive heart disease without heart failure: Secondary | ICD-10-CM | POA: Insufficient documentation

## 2021-04-30 DIAGNOSIS — J449 Chronic obstructive pulmonary disease, unspecified: Secondary | ICD-10-CM | POA: Diagnosis not present

## 2021-04-30 DIAGNOSIS — G2581 Restless legs syndrome: Secondary | ICD-10-CM | POA: Insufficient documentation

## 2021-04-30 DIAGNOSIS — Z01812 Encounter for preprocedural laboratory examination: Secondary | ICD-10-CM | POA: Insufficient documentation

## 2021-04-30 DIAGNOSIS — Z20822 Contact with and (suspected) exposure to covid-19: Secondary | ICD-10-CM | POA: Diagnosis not present

## 2021-04-30 DIAGNOSIS — K5900 Constipation, unspecified: Secondary | ICD-10-CM | POA: Diagnosis not present

## 2021-04-30 DIAGNOSIS — C44321 Squamous cell carcinoma of skin of nose: Secondary | ICD-10-CM | POA: Insufficient documentation

## 2021-04-30 DIAGNOSIS — F1414 Cocaine abuse with cocaine-induced mood disorder: Secondary | ICD-10-CM | POA: Insufficient documentation

## 2021-04-30 HISTORY — DX: Cardiac murmur, unspecified: R01.1

## 2021-04-30 HISTORY — DX: Pneumonia, unspecified organism: J18.9

## 2021-04-30 HISTORY — DX: Dyspnea, unspecified: R06.00

## 2021-04-30 HISTORY — DX: Chronic obstructive pulmonary disease, unspecified: J44.9

## 2021-04-30 LAB — SURGICAL PCR SCREEN
MRSA, PCR: NEGATIVE
Staphylococcus aureus: NEGATIVE

## 2021-04-30 LAB — COMPREHENSIVE METABOLIC PANEL
ALT: 153 U/L — ABNORMAL HIGH (ref 0–44)
AST: 140 U/L — ABNORMAL HIGH (ref 15–41)
Albumin: 3.9 g/dL (ref 3.5–5.0)
Alkaline Phosphatase: 62 U/L (ref 38–126)
Anion gap: 8 (ref 5–15)
BUN: 12 mg/dL (ref 8–23)
CO2: 22 mmol/L (ref 22–32)
Calcium: 9.3 mg/dL (ref 8.9–10.3)
Chloride: 109 mmol/L (ref 98–111)
Creatinine, Ser: 0.48 mg/dL (ref 0.44–1.00)
GFR, Estimated: >60 mL/min (ref >60)
Glucose, Bld: 117 mg/dL — ABNORMAL HIGH (ref 70–99)
Potassium: 3.3 mmol/L — ABNORMAL LOW (ref 3.5–5.1)
Sodium: 139 mmol/L (ref 135–145)
Total Bilirubin: 1.4 mg/dL — ABNORMAL HIGH (ref 0.3–1.2)
Total Protein: 6.6 g/dL (ref 6.5–8.1)

## 2021-04-30 LAB — TYPE AND SCREEN
ABO/RH(D): O POS
Antibody Screen: NEGATIVE

## 2021-04-30 LAB — CBC
HCT: 43.6 % (ref 36.0–46.0)
Hemoglobin: 14.9 g/dL (ref 12.0–15.0)
MCH: 32.5 pg (ref 26.0–34.0)
MCHC: 34.2 g/dL (ref 30.0–36.0)
MCV: 95.2 fL (ref 80.0–100.0)
Platelets: 117 10*3/uL — ABNORMAL LOW (ref 150–400)
RBC: 4.58 MIL/uL (ref 3.87–5.11)
RDW: 13.9 % (ref 11.5–15.5)
WBC: 6.5 10*3/uL (ref 4.0–10.5)
nRBC: 0 % (ref 0.0–0.2)

## 2021-04-30 NOTE — Progress Notes (Signed)
Abnormal lab in PAT: AST 140; ALT 153. Dr. Zada Finders office was called and these results were communicated to [facility], the surgical scheduler.

## 2021-04-30 NOTE — Progress Notes (Addendum)
PCP - Tamela Gammon, NP Cardiologist - denies  PPM/ICD - denies Device Orders - n/a Rep Notified - n/a  Chest x-ray - n/a EKG - 04/30/2021 Stress Test - denies ECHO - denies Cardiac Cath - denies  Sleep Study - denies CPAP - n/a  Fasting Blood Sugar - n/a  Blood Thinner Instructions: n/a  Aspirin Instructions: Patient was instructed: As of today, STOP taking any Aspirin (unless otherwise instructed by your surgeon) Aleve, Naproxen, Ibuprofen, Motrin, Advil, Goody's, BC's, all herbal medications, fish oil, and all vitamins.  ERAS Protcol - yes PRE-SURGERY Ensure or G2- n/a  COVID TEST- yes, done in PAT on 04/30/2021   Anesthesia review: yes - patient verbalized that her PCP told her about one year ago that she has heart murmur. Patient denied any distress; no other cardiac history. Abnormal lab in PAT (AST 140, ALT 153). Dr. Zada Finders was notified.  Patient denies shortness of breath, fever, cough and chest pain at PAT appointment   All instructions explained to the patient, with a verbal understanding of the material. Patient agrees to go over the instructions while at home for a better understanding. Patient also instructed to self quarantine after being tested for COVID-19. The opportunity to ask questions was provided.

## 2021-05-03 ENCOUNTER — Encounter (HOSPITAL_COMMUNITY): Payer: Self-pay

## 2021-05-03 LAB — SARS CORONAVIRUS 2 BY RT PCR (HOSPITAL ORDER, PERFORMED IN ~~LOC~~ HOSPITAL LAB): SARS Coronavirus 2: NEGATIVE

## 2021-05-03 NOTE — Progress Notes (Signed)
Anesthesia Chart Review:  Case: [id number] Date/Time: 05/04/21 1138   Procedure: Cervical 4-5 Cervical 5-6 Cervical 6-7 Anterior cervical decompression/discectomy/fusion   Anesthesia type: General   Pre-op diagnosis: Cervical myelopathy   Location: [facility] OR ROOM 65 / [place] OR   Surgeons: Judith Part, MD       DISCUSSION: Patient is a 65 year old female scheduled for the above procedure.   History includes smoking, COPD, dyspnea, asthma, HTN, murmur (mild MR/AI 09/1538), illicit drug use (not currently per 04/07/21 [facility] notes), Hepatitis C, hypothyroidism.    PAT RN forwarded chart for review. Murmur history is listed and also LFTs elevated without recent comparison labs available. I attempted to call patient 05/03/21 AM, but phone call dropped or disconnected within about 60 seconds x2. In the brief time we spoke, she did share with me that primary care is through [facility]. I was able to receive PCP records with last office note, EKG, echo, and labs. Last visit 04/07/21 by Dr. Woody Seller for COPD exacerbation. She told him about neck surgery plans. Started on Breo Ellipta 100 mcg-25 mcg 1 inhalation daily and given methylprednisolone 40 mg injection. Smoking cessation encouraged and noted, "She is at risk of COPD worsening and pulmonary complication postoperatively". Her last labs done there were on 08/25/20. CBC was normal with low normal PLT count 150K and her AST and ALT were 301 and 259, respectively and total bilirubin was 1.1. When compared to these labs, total bilirubin was 1.4 and AST 140 and ALT 152 on 04/30/21 are still elevated, but AST and ALT improved over the past seven months.  She has known Hepatitis C. No severe thrombocytopenia with labs. No current alcohol use is documented.    04/30/21 presurgical COVID-19 test is still reading as "In process", so I contacted Lab. Specimen located, and they will process.   Anesthesia team to evaluate on the day of surgery.    VS: BP 140/61   Pulse 97    Temp 36.9 C (Oral)   Resp 17   SpO2 96%   PROVIDERS: PCP is with [facility]. She has seen Nicanor Bake, FNP-C, Jerene Bears, MD and  Annamarie Dawley, NP   LABS: Preoperative labs noted. AST 140, ALT 152. PLT count 117K. AST and ALT are less elevated when compared to 08/25/20 labs from [facility]. SEE DISCUSSION.  (all labs ordered are listed, but only abnormal results are displayed)  Labs Reviewed  CBC - Abnormal; Notable for the following components:      Result Value   Platelets 117 (*)    All other components within normal limits  COMPREHENSIVE METABOLIC PANEL - Abnormal; Notable for the following components:   Potassium 3.3 (*)    Glucose, Bld 117 (*)    AST 140 (*)    ALT 153 (*)    Total Bilirubin 1.4 (*)    All other components within normal limits  SURGICAL PCR SCREEN  SARS CORONAVIRUS 2 (TAT 6-24 HRS)  TYPE AND SCREEN     IMAGES: CT C-spine 03/02/21: IMPRESSION: 1. C4-C5 severe spinal canal stenosis and severe bilateral neural foraminal narrowing. 2. C5-C6 moderate to severe spinal canal stenosis, moderate right neural foraminal narrowing, and severe left neural foraminal narrowing. 3. C6-C7 moderate to severe spinal canal stenosis, severe right neural foraminal narrowing, and moderate left neural foraminal narrowing. 4. C3-C4 severe left and moderate right neural foraminal narrowing.   MRI C-spine 10/28/20: IMPRESSION: - Moderate-to-severe spinal canal and bilateral neural foraminal  narrowing at the C4-5, C5-6 and C6-7 levels. 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 - Moderate to severe left C3-4 neural foraminal narrowing.   EKG:  EKG 04/30/21:  Normal sinus rhythm Right atrial enlargement Borderline ECG No significant change since last tracing Confirmed by Kirk Ruths [phone]) on 04/30/2021 5:09:54 PM   CV: Echo 09/07/20 ([facility]): Conclusion: Left ventricular cavity is normal in size.  Normal systolic global function of the  left ventricle.  Calculated left ventricle EF 55.18%. Aortic valve structurally normal.  Mild aortic regurgitation.  No aortic stenosis.  Mild aortic valve sclerosis.  Mitral valve structurally normal.  Mild mitral regurgitation.  Past Medical History:  Diagnosis Date   Anxiety    Asthma    COPD (chronic obstructive pulmonary disease) (HCC)    Dyspnea    Heart murmur    09/07/20 echo ([facility]): LVEF 55%, mild AV sclerosis, mild AI, mild MR   Hepatitis C    History of drug abuse ([place])    Hypertension    Hypothyroidism    Pneumonia    Thyroid disease     Past Surgical History:  Procedure Laterality Date   COLONOSCOPY WITH PROPOFOL N/A 03/26/2018   Procedure: COLONOSCOPY WITH PROPOFOL;  Surgeon: Daneil Dolin, MD;  Location: [facility];  Service: Endoscopy;  Laterality: N/A;  11:15am   POLYPECTOMY  03/26/2018   Procedure: POLYPECTOMY;  Surgeon: Daneil Dolin, MD;  Location: [facility];  Service: Endoscopy;;  splenic flexure   TONSILLECTOMY     as a child   TUBAL LIGATION      MEDICATIONS:  amitriptyline (ELAVIL) 10 MG tablet   Cholecalciferol (VITAMIN D3) 125 MCG (5000 UT) TABS   furosemide (LASIX) 20 MG tablet   gabapentin (NEURONTIN) 300 MG capsule   HYDROcodone-acetaminophen (NORCO) 10-325 MG tablet   levothyroxine (SYNTHROID, LEVOTHROID) 75 MCG tablet   Magnesium 500 MG TABS   methocarbamol (ROBAXIN) 500 MG tablet   metoprolol succinate (TOPROL-XL) 25 MG 24 hr tablet   Multiple Vitamin (MULTIVITAMIN WITH MINERALS) TABS tablet   Potassium 99 MG TABS   PROAIR HFA 108 (90 Base) MCG/ACT inhaler   No current facility-administered medications for this encounter.    Myra Gianotti, PA-C Surgical Short Stay/Anesthesiology [facility] Phone [phone] [facility] Phone [phone] 05/03/2021 2:51 PM

## 2021-05-03 NOTE — Anesthesia Preprocedure Evaluation (Addendum)
Anesthesia Evaluation  Patient identified by MRN, date of birth, ID band Patient awake    Reviewed: Allergy & Precautions, NPO status , Patient's Chart, lab work & pertinent test results, reviewed documented beta blocker date and time   Airway Mallampati: II  TM Distance: >3 FB Neck ROM: Full    Dental  (+) Edentulous Upper, Edentulous Lower, Dental Advisory Given   Pulmonary shortness of breath, asthma , COPD,  COPD inhaler, Current SmokerPatient did not abstain from smoking.,    Pulmonary exam normal breath sounds clear to auscultation       Cardiovascular hypertension, Pt. on home beta blockers and Pt. on medications Normal cardiovascular exam Rhythm:Regular Rate:Normal  Echo 09/07/20 ([facility]): Conclusion: Left ventricular cavity is normal in size.  Normal systolic global function of the left ventricle.  Calculated left ventricle EF 55.18%. Aortic valve structurally normal.  Mild aortic regurgitation.  No aortic stenosis.  Mild aortic valve sclerosis.  Mitral valve structurally normal.  Mild mitral regurgitation.    Neuro/Psych PSYCHIATRIC DISORDERS Anxiety negative neurological ROS     GI/Hepatic negative GI ROS, (+)     substance abuse  cocaine use, Hepatitis -, C  Endo/Other  Hypothyroidism   Renal/GU negative Renal ROS  negative genitourinary   Musculoskeletal negative musculoskeletal ROS (+)   Abdominal   Peds  Hematology negative hematology ROS (+)   Anesthesia Other Findings History includes smoking, COPD, dyspnea, asthma, HTN, murmur (mild MR/AI 09/3381), illicit drug use (not currently per 04/07/21 [facility] notes), Hepatitis C, hypothyroidism.  Reproductive/Obstetrics                            Anesthesia Physical Anesthesia Plan  ASA: 3  Anesthesia Plan: General   Post-op Pain Management:    Induction: Intravenous  PONV Risk Score and Plan: 2 and Midazolam, Dexamethasone  and Ondansetron  Airway Management Planned: Oral ETT  Additional Equipment:   Intra-op Plan:   Post-operative Plan: Extubation in OR  Informed Consent: I have reviewed the patients History and Physical, chart, labs and discussed the procedure including the risks, benefits and alternatives for the proposed anesthesia with the patient or authorized representative who has indicated his/her understanding and acceptance.     Dental advisory given  Plan Discussed with: CRNA  Anesthesia Plan Comments:        Anesthesia Quick Evaluation

## 2021-05-07 ENCOUNTER — Observation Stay (HOSPITAL_COMMUNITY)
Admission: RE | Admit: 2021-05-07 | Discharge: 2021-05-08 | Disposition: A | Discharge status: Home / Self Care | Payer: Medicaid Other | Attending: Neurological Surgery | Admitting: Neurological Surgery

## 2021-05-07 ENCOUNTER — Ambulatory Visit (HOSPITAL_COMMUNITY): Payer: Medicaid Other

## 2021-05-07 ENCOUNTER — Other Ambulatory Visit: Payer: Self-pay

## 2021-05-07 ENCOUNTER — Encounter (HOSPITAL_COMMUNITY): Payer: Self-pay | Admitting: Neurological Surgery

## 2021-05-07 ENCOUNTER — Encounter (HOSPITAL_COMMUNITY)
Admission: RE | Disposition: A | Discharge status: Home / Self Care | Payer: Self-pay | Source: Home / Self Care | Attending: Neurological Surgery

## 2021-05-07 ENCOUNTER — Ambulatory Visit (HOSPITAL_COMMUNITY): Payer: Medicaid Other | Admitting: Certified Registered Nurse Anesthetist

## 2021-05-07 ENCOUNTER — Ambulatory Visit (HOSPITAL_COMMUNITY): Payer: Medicaid Other | Admitting: Vascular Surgery

## 2021-05-07 DIAGNOSIS — J449 Chronic obstructive pulmonary disease, unspecified: Secondary | ICD-10-CM | POA: Diagnosis not present

## 2021-05-07 DIAGNOSIS — I10 Essential (primary) hypertension: Secondary | ICD-10-CM | POA: Diagnosis not present

## 2021-05-07 DIAGNOSIS — G992 Myelopathy in diseases classified elsewhere: Secondary | ICD-10-CM | POA: Diagnosis not present

## 2021-05-07 DIAGNOSIS — M2578 Osteophyte, vertebrae: Secondary | ICD-10-CM | POA: Diagnosis not present

## 2021-05-07 DIAGNOSIS — Z8249 Family history of ischemic heart disease and other diseases of the circulatory system: Secondary | ICD-10-CM

## 2021-05-07 DIAGNOSIS — M4802 Spinal stenosis, cervical region: Secondary | ICD-10-CM | POA: Diagnosis not present

## 2021-05-07 DIAGNOSIS — Z79899 Other long term (current) drug therapy: Secondary | ICD-10-CM | POA: Insufficient documentation

## 2021-05-07 DIAGNOSIS — G959 Disease of spinal cord, unspecified: Secondary | ICD-10-CM | POA: Diagnosis present

## 2021-05-07 DIAGNOSIS — F17210 Nicotine dependence, cigarettes, uncomplicated: Secondary | ICD-10-CM | POA: Insufficient documentation

## 2021-05-07 DIAGNOSIS — E039 Hypothyroidism, unspecified: Secondary | ICD-10-CM | POA: Diagnosis present

## 2021-05-07 DIAGNOSIS — K08109 Complete loss of teeth, unspecified cause, unspecified class: Secondary | ICD-10-CM | POA: Diagnosis present

## 2021-05-07 DIAGNOSIS — I080 Rheumatic disorders of both mitral and aortic valves: Secondary | ICD-10-CM | POA: Diagnosis not present

## 2021-05-07 DIAGNOSIS — Z419 Encounter for procedure for purposes other than remedying health state, unspecified: Secondary | ICD-10-CM

## 2021-05-07 DIAGNOSIS — Z20822 Contact with and (suspected) exposure to covid-19: Secondary | ICD-10-CM | POA: Diagnosis present

## 2021-05-07 DIAGNOSIS — F419 Anxiety disorder, unspecified: Secondary | ICD-10-CM | POA: Diagnosis present

## 2021-05-07 DIAGNOSIS — B1920 Unspecified viral hepatitis C without hepatic coma: Secondary | ICD-10-CM | POA: Diagnosis not present

## 2021-05-07 DIAGNOSIS — Z8701 Personal history of pneumonia (recurrent): Secondary | ICD-10-CM

## 2021-05-07 HISTORY — PX: ANTERIOR CERVICAL DECOMP/DISCECTOMY FUSION: SHX1161

## 2021-05-07 LAB — ABO/RH: ABO/RH(D): O POS

## 2021-05-07 SURGERY — ANTERIOR CERVICAL DECOMPRESSION/DISCECTOMY FUSION 3 LEVELS
Anesthesia: General

## 2021-05-07 MED ORDER — ALBUTEROL SULFATE HFA 108 (90 BASE) MCG/ACT IN AERS: 1.0000 | INHALATION_SPRAY | Freq: Four times a day (QID) | RESPIRATORY_TRACT | Status: DC | PRN

## 2021-05-07 MED ORDER — CHLORHEXIDINE GLUCONATE CLOTH 2 % EX PADS: 6.0000 | MEDICATED_PAD | Freq: Once | CUTANEOUS | Status: DC

## 2021-05-07 MED ORDER — METHOCARBAMOL 500 MG PO TABS
500.0000 mg | ORAL_TABLET | Freq: Three times a day (TID) | ORAL | Status: DC | PRN
  Administered 2021-05-08: 500 mg via ORAL
  Filled 2021-05-07: qty 1

## 2021-05-07 MED ORDER — PHENYLEPHRINE HCL-NACL 20-0.9 MG/250ML-% IV SOLN
INTRAVENOUS | Status: DC | PRN
  Administered 2021-05-07: 50 ug/min via INTRAVENOUS

## 2021-05-07 MED ORDER — LEVOTHYROXINE SODIUM 75 MCG PO TABS
75.0000 ug | ORAL_TABLET | Freq: Every day | ORAL | Status: DC
  Administered 2021-05-08: 75 ug via ORAL
  Filled 2021-05-07: qty 1

## 2021-05-07 MED ORDER — PHENYLEPHRINE 40 MCG/ML (10ML) SYRINGE FOR IV PUSH (FOR BLOOD PRESSURE SUPPORT)
PREFILLED_SYRINGE | INTRAVENOUS | Status: DC | PRN
  Administered 2021-05-07: 80 ug via INTRAVENOUS
  Administered 2021-05-07: 160 ug via INTRAVENOUS

## 2021-05-07 MED ORDER — FENTANYL CITRATE (PF) 100 MCG/2ML IJ SOLN
INTRAMUSCULAR | Status: AC
  Filled 2021-05-07: qty 2

## 2021-05-07 MED ORDER — OXYCODONE HCL 5 MG PO TABS: 5.0000 mg | ORAL_TABLET | ORAL | Status: DC | PRN

## 2021-05-07 MED ORDER — SODIUM CHLORIDE 0.9% FLUSH: 3.0000 mL | INTRAVENOUS | Status: DC | PRN

## 2021-05-07 MED ORDER — OXYCODONE HCL 5 MG PO TABS
10.0000 mg | ORAL_TABLET | ORAL | Status: DC | PRN
Start: 2021-05-07 — End: 2021-05-08
  Administered 2021-05-07 – 2021-05-08 (×2): 10 mg via ORAL
  Filled 2021-05-07 (×2): qty 2

## 2021-05-07 MED ORDER — SODIUM CHLORIDE 0.9% FLUSH: 3.0000 mL | Freq: Two times a day (BID) | INTRAVENOUS | Status: DC

## 2021-05-07 MED ORDER — LACTATED RINGERS IV SOLN: INTRAVENOUS | Status: DC | PRN

## 2021-05-07 MED ORDER — LIDOCAINE 2% (20 MG/ML) 5 ML SYRINGE
INTRAMUSCULAR | Status: DC | PRN
  Administered 2021-05-07: 40 mg via INTRAVENOUS

## 2021-05-07 MED ORDER — THROMBIN 5000 UNITS EX SOLR
CUTANEOUS | Status: AC
  Filled 2021-05-07: qty 5000

## 2021-05-07 MED ORDER — CEFAZOLIN SODIUM-DEXTROSE 2-4 GM/100ML-% IV SOLN
2.0000 g | INTRAVENOUS | Status: AC
  Administered 2021-05-07: 2 g via INTRAVENOUS

## 2021-05-07 MED ORDER — DEXAMETHASONE SODIUM PHOSPHATE 10 MG/ML IJ SOLN
INTRAMUSCULAR | Status: DC | PRN
  Administered 2021-05-07: 10 mg via INTRAVENOUS

## 2021-05-07 MED ORDER — ONDANSETRON HCL 4 MG/2ML IJ SOLN: 4.0000 mg | Freq: Four times a day (QID) | INTRAMUSCULAR | Status: DC | PRN

## 2021-05-07 MED ORDER — ACETAMINOPHEN 325 MG PO TABS
650.0000 mg | ORAL_TABLET | ORAL | Status: DC | PRN
  Administered 2021-05-08: 650 mg via ORAL
  Filled 2021-05-07: qty 2

## 2021-05-07 MED ORDER — ACETAMINOPHEN 500 MG PO TABS
ORAL_TABLET | ORAL | Status: AC
  Administered 2021-05-07: 500 mg via ORAL
  Filled 2021-05-07: qty 2

## 2021-05-07 MED ORDER — ONDANSETRON HCL 4 MG PO TABS: 4.0000 mg | ORAL_TABLET | Freq: Four times a day (QID) | ORAL | Status: DC | PRN

## 2021-05-07 MED ORDER — FENTANYL CITRATE (PF) 100 MCG/2ML IJ SOLN
INTRAMUSCULAR | Status: DC | PRN
  Administered 2021-05-07 (×2): 50 ug via INTRAVENOUS
  Administered 2021-05-07: 100 ug via INTRAVENOUS
  Administered 2021-05-07: 50 ug via INTRAVENOUS

## 2021-05-07 MED ORDER — GABAPENTIN 300 MG PO CAPS
300.0000 mg | ORAL_CAPSULE | Freq: Three times a day (TID) | ORAL | Status: DC
  Administered 2021-05-07 – 2021-05-08 (×2): 300 mg via ORAL
  Filled 2021-05-07 (×2): qty 1

## 2021-05-07 MED ORDER — PROPOFOL 10 MG/ML IV BOLUS
INTRAVENOUS | Status: AC
  Filled 2021-05-07: qty 20

## 2021-05-07 MED ORDER — ADULT MULTIVITAMIN W/MINERALS CH
1.0000 | ORAL_TABLET | Freq: Every morning | ORAL | Status: DC
  Administered 2021-05-08: 1 via ORAL

## 2021-05-07 MED ORDER — 0.9 % SODIUM CHLORIDE (POUR BTL) OPTIME
TOPICAL | Status: DC | PRN
  Administered 2021-05-07: 1000 mL

## 2021-05-07 MED ORDER — MIDAZOLAM HCL 2 MG/2ML IJ SOLN
INTRAMUSCULAR | Status: AC
  Filled 2021-05-07: qty 2

## 2021-05-07 MED ORDER — FUROSEMIDE 20 MG PO TABS: 20.0000 mg | ORAL_TABLET | Freq: Two times a day (BID) | ORAL | Status: DC

## 2021-05-07 MED ORDER — LIDOCAINE-EPINEPHRINE 1 %-1:100000 IJ SOLN
INTRAMUSCULAR | Status: AC
  Filled 2021-05-07: qty 1

## 2021-05-07 MED ORDER — POLYETHYLENE GLYCOL 3350 17 G PO PACK: 17.0000 g | PACK | Freq: Every day | ORAL | Status: DC | PRN

## 2021-05-07 MED ORDER — LIDOCAINE-EPINEPHRINE 1 %-1:100000 IJ SOLN
INTRAMUSCULAR | Status: DC | PRN
  Administered 2021-05-07: 10 mL

## 2021-05-07 MED ORDER — ACETAMINOPHEN 650 MG RE SUPP: 650.0000 mg | RECTAL | Status: DC | PRN

## 2021-05-07 MED ORDER — ACETAMINOPHEN 500 MG PO TABS: 1000.0000 mg | ORAL_TABLET | Freq: Once | ORAL | Status: AC

## 2021-05-07 MED ORDER — DOCUSATE SODIUM 100 MG PO CAPS
100.0000 mg | ORAL_CAPSULE | Freq: Two times a day (BID) | ORAL | Status: DC
  Administered 2021-05-07 – 2021-05-08 (×2): 100 mg via ORAL
  Filled 2021-05-07 (×2): qty 1

## 2021-05-07 MED ORDER — AMITRIPTYLINE HCL 10 MG PO TABS
10.0000 mg | ORAL_TABLET | Freq: Every day | ORAL | Status: DC
  Administered 2021-05-07: 10 mg via ORAL
  Filled 2021-05-07 (×2): qty 1

## 2021-05-07 MED ORDER — CEFAZOLIN SODIUM-DEXTROSE 2-4 GM/100ML-% IV SOLN
2.0000 g | Freq: Three times a day (TID) | INTRAVENOUS | Status: AC
Start: 2021-05-07 — End: 2021-05-08
  Administered 2021-05-07 – 2021-05-08 (×2): 2 g via INTRAVENOUS
  Filled 2021-05-07 (×2): qty 100

## 2021-05-07 MED ORDER — CYCLOBENZAPRINE HCL 10 MG PO TABS
10.0000 mg | ORAL_TABLET | Freq: Three times a day (TID) | ORAL | Status: DC | PRN
  Administered 2021-05-07 – 2021-05-08 (×2): 10 mg via ORAL
  Filled 2021-05-07 (×2): qty 1

## 2021-05-07 MED ORDER — POTASSIUM 99 MG PO TABS: 99.0000 mg | ORAL_TABLET | Freq: Every morning | ORAL | Status: DC

## 2021-05-07 MED ORDER — CHLORHEXIDINE GLUCONATE 0.12 % MT SOLN
OROMUCOSAL | Status: AC
  Administered 2021-05-07: 15 mL via OROMUCOSAL
  Filled 2021-05-07: qty 15

## 2021-05-07 MED ORDER — PROPOFOL 10 MG/ML IV BOLUS
INTRAVENOUS | Status: DC | PRN
  Administered 2021-05-07: 150 mg via INTRAVENOUS

## 2021-05-07 MED ORDER — SODIUM CHLORIDE 0.9 % IV SOLN: 250.0000 mL | INTRAVENOUS | Status: DC

## 2021-05-07 MED ORDER — MENTHOL 3 MG MT LOZG: 1.0000 | LOZENGE | OROMUCOSAL | Status: DC | PRN

## 2021-05-07 MED ORDER — LACTATED RINGERS IV SOLN: INTRAVENOUS | Status: DC

## 2021-05-07 MED ORDER — METOPROLOL SUCCINATE ER 25 MG PO TB24
12.5000 mg | ORAL_TABLET | Freq: Every day | ORAL | Status: DC
  Administered 2021-05-08: 12.5 mg via ORAL
  Filled 2021-05-07: qty 1

## 2021-05-07 MED ORDER — PHENOL 1.4 % MT LIQD: 1.0000 | OROMUCOSAL | Status: DC | PRN

## 2021-05-07 MED ORDER — CEFAZOLIN SODIUM-DEXTROSE 2-4 GM/100ML-% IV SOLN
INTRAVENOUS | Status: AC
  Filled 2021-05-07: qty 100

## 2021-05-07 MED ORDER — FENTANYL CITRATE (PF) 100 MCG/2ML IJ SOLN
25.0000 ug | INTRAMUSCULAR | Status: DC | PRN
  Administered 2021-05-07 (×3): 50 ug via INTRAVENOUS

## 2021-05-07 MED ORDER — SUGAMMADEX SODIUM 200 MG/2ML IV SOLN
INTRAVENOUS | Status: DC | PRN
  Administered 2021-05-07: 200 mg via INTRAVENOUS

## 2021-05-07 MED ORDER — HYDROMORPHONE HCL 1 MG/ML IJ SOLN
1.0000 mg | INTRAMUSCULAR | Status: DC | PRN
  Administered 2021-05-07 – 2021-05-08 (×2): 1 mg via INTRAVENOUS
  Filled 2021-05-07 (×2): qty 1

## 2021-05-07 MED ORDER — ONDANSETRON HCL 4 MG/2ML IJ SOLN
INTRAMUSCULAR | Status: DC | PRN
  Administered 2021-05-07: 4 mg via INTRAVENOUS

## 2021-05-07 MED ORDER — CHLORHEXIDINE GLUCONATE 0.12 % MT SOLN: 15.0000 mL | Freq: Once | OROMUCOSAL | Status: AC

## 2021-05-07 MED ORDER — ROCURONIUM BROMIDE 100 MG/10ML IV SOLN
INTRAVENOUS | Status: DC | PRN
  Administered 2021-05-07: 20 mg via INTRAVENOUS
  Administered 2021-05-07: 50 mg via INTRAVENOUS

## 2021-05-07 MED ORDER — MIDAZOLAM HCL 5 MG/5ML IJ SOLN
INTRAMUSCULAR | Status: DC | PRN
  Administered 2021-05-07: 2 mg via INTRAVENOUS

## 2021-05-07 MED ORDER — FENTANYL CITRATE (PF) 250 MCG/5ML IJ SOLN
INTRAMUSCULAR | Status: AC
  Filled 2021-05-07: qty 5

## 2021-05-07 MED ORDER — ORAL CARE MOUTH RINSE: 15.0000 mL | Freq: Once | OROMUCOSAL | Status: AC

## 2021-05-07 MED ORDER — THROMBIN 5000 UNITS EX SOLR: OROMUCOSAL | Status: DC | PRN

## 2021-05-07 SURGICAL SUPPLY — 52 items
BAG COUNTER SPONGE SURGICOUNT (BAG) ×4 IMPLANT
BAND RUBBER #18 3X1/16 STRL (MISCELLANEOUS) ×4 IMPLANT
BENZOIN TINCTURE PRP APPL 2/3 (GAUZE/BANDAGES/DRESSINGS) IMPLANT
BLADE CLIPPER SURG (BLADE) IMPLANT
BLADE SURG 11 STRL SS (BLADE) ×2 IMPLANT
BUR MATCHSTICK NEURO 3.0 LAGG (BURR) ×2 IMPLANT
CANISTER SUCT 3000ML PPV (MISCELLANEOUS) ×2 IMPLANT
DECANTER SPIKE VIAL GLASS SM (MISCELLANEOUS) IMPLANT
DERMABOND ADVANCED (GAUZE/BANDAGES/DRESSINGS) ×1
DERMABOND ADVANCED .7 DNX12 (GAUZE/BANDAGES/DRESSINGS) ×1 IMPLANT
DRAPE C-ARM 42X72 X-RAY (DRAPES) ×4 IMPLANT
DRAPE HALF SHEET 40X57 (DRAPES) IMPLANT
DRAPE LAPAROTOMY 100X72 PEDS (DRAPES) ×2 IMPLANT
DRAPE MICROSCOPE LEICA (MISCELLANEOUS) ×2 IMPLANT
DURAPREP 6ML APPLICATOR 50/CS (WOUND CARE) ×2 IMPLANT
ELECT COATED BLADE 2.86 ST (ELECTRODE) ×2 IMPLANT
ELECT REM PT RETURN 9FT ADLT (ELECTROSURGICAL) ×2
ELECTRODE REM PT RTRN 9FT ADLT (ELECTROSURGICAL) ×1 IMPLANT
GAUZE 4X4 16PLY ~~LOC~~+RFID DBL (SPONGE) IMPLANT
GLOVE EXAM NITRILE LRG STRL (GLOVE) IMPLANT
GLOVE EXAM NITRILE XL STR (GLOVE) IMPLANT
GLOVE EXAM NITRILE XS STR PU (GLOVE) IMPLANT
GLOVE SURG LTX SZ7.5 (GLOVE) ×2 IMPLANT
GLOVE SURG UNDER POLY LF SZ7.5 (GLOVE) ×4 IMPLANT
GOWN STRL REUS W/ TWL LRG LVL3 (GOWN DISPOSABLE) ×2 IMPLANT
GOWN STRL REUS W/ TWL XL LVL3 (GOWN DISPOSABLE) IMPLANT
GOWN STRL REUS W/TWL 2XL LVL3 (GOWN DISPOSABLE) IMPLANT
GOWN STRL REUS W/TWL LRG LVL3 (GOWN DISPOSABLE) ×4
GOWN STRL REUS W/TWL XL LVL3 (GOWN DISPOSABLE)
HEMOSTAT POWDER KIT SURGIFOAM (HEMOSTASIS) ×2 IMPLANT
KIT BASIN OR (CUSTOM PROCEDURE TRAY) ×2 IMPLANT
KIT TURNOVER KIT B (KITS) ×2 IMPLANT
NEEDLE HYPO 22GX1.5 SAFETY (NEEDLE) ×2 IMPLANT
NEEDLE SPNL 18GX3.5 QUINCKE PK (NEEDLE) ×2 IMPLANT
NEEDLE SPNL 22GX3.5 QUINCKE BK (NEEDLE) ×2 IMPLANT
NS IRRIG 1000ML POUR BTL (IV SOLUTION) ×2 IMPLANT
PACK LAMINECTOMY NEURO (CUSTOM PROCEDURE TRAY) ×2 IMPLANT
PAD ARMBOARD 7.5X6 YLW CONV (MISCELLANEOUS) ×6 IMPLANT
PIN DISTRACTION 14MM (PIN) IMPLANT
PLATE ATLANTIS ELITE 52 (Plate) ×2 IMPLANT
SCREW SELF TAP VAR 4.0X13 (Screw) ×16 IMPLANT
SPACER BONE CORNERSTONE 7X14 (Orthopedic Implant) ×6 IMPLANT
SPONGE INTESTINAL PEANUT (DISPOSABLE) ×2 IMPLANT
STAPLER VISISTAT 35W (STAPLE) IMPLANT
SUT MNCRL AB 3-0 PS2 18 (SUTURE) ×2 IMPLANT
SUT VIC AB 3-0 SH 8-18 (SUTURE) ×2 IMPLANT
TAPE CLOTH 3X10 TAN LF (GAUZE/BANDAGES/DRESSINGS) IMPLANT
TOWEL GREEN STERILE (TOWEL DISPOSABLE) ×2 IMPLANT
TOWEL GREEN STERILE FF (TOWEL DISPOSABLE) ×2 IMPLANT
TRAY FOLEY MTR SLVR 16FR STAT (SET/KITS/TRAYS/PACK) IMPLANT
TRAY FOLEY W/BAG SLVR 14FR (SET/KITS/TRAYS/PACK) ×2 IMPLANT
WATER STERILE IRR 1000ML POUR (IV SOLUTION) ×2 IMPLANT

## 2021-05-07 NOTE — Anesthesia Procedure Notes (Addendum)
Procedure Name: Intubation Date/Time: 05/07/2021 2:42 PM Performed by: Eligha Bridegroom, CRNA Pre-anesthesia Checklist: Patient identified, Emergency Drugs available, Suction available, Patient being monitored and Timeout performed Patient Re-evaluated:Patient Re-evaluated prior to induction Oxygen Delivery Method: Circle system utilized Preoxygenation: Pre-oxygenation with 100% oxygen Induction Type: IV induction Ventilation: Mask ventilation with difficulty and Oral airway inserted - appropriate to patient size Laryngoscope Size: Glidescope Grade View: Grade I Tube type: Oral Number of attempts: 1 Airway Equipment and Method: Stylet Placement Confirmation: ETT inserted through vocal cords under direct vision, positive ETCO2 and breath sounds checked- equal and bilateral Secured at: 20 cm Tube secured with: Tape Dental Injury: Teeth and Oropharynx as per pre-operative assessment  Comments: Head and neck remained neutral during laryngoscopy and intubation.

## 2021-05-07 NOTE — Op Note (Signed)
PATIENT: Monica [name]  PROCEDURE DATE: 05/07/21  PRE-OPERATIVE DIAGNOSIS:  Cervical myelopathy   POST-OPERATIVE DIAGNOSIS:  Same   PROCEDURE:  C4-C5, C5-6, C6-7 Anterior Cervical Discectomy and Instrumented Fusion   SURGEON:  Surgeon(s) and Role:    Judith Part, MD - Primary   ANESTHESIA: ETGA   BRIEF HISTORY: This is a 65 year old woman who presented with progressive symptoms of severe cervical myelopathy. The patient was found to have severe canal stenosis with cord signal change, I therefore recommended an ACDF to decompress the stenotic levels. This was discussed with the patient as well as risks, benefits, and alternatives and the patient wished to proceed with surgical treatment.   OPERATIVE DETAIL: The patient was taken to the operating room and placed on the OR table in the supine position. A formal time out was performed with two patient identifiers and confirmed the operative site. Anesthesia was induced by the anesthesia team.  Fluoroscopy was used to localize the surgical level and an incision was marked in a skin crease. The area was then prepped and draped in a sterile fashion. A transverse linear incision was made on the right side of the neck. The platysma was divided and the sternocleidomastoid muscle was identified. The carotid sheath was palpated, identified, and retracted laterally with the sternocleidomastoid muscle. The strap muscles were identified and retracted medially and the pretracheal fascia was entered. A bent spinal needle was used with fluoroscopy to localize the surgical level after dissection. The longus colli were elevated bilaterally and a self-retaining retractor was placed. The endotracheal tube cuff balloon was deflated and reinflated after retractor placement.   Anterior osteophytes were removed until flush with the anterior vertebral body. The disc annulus was incised and a complete C4-C5 discectomy was performed. The posterior longitudinal  ligament was incised followed by ligamentous and bony removal until no central canal stenosis was present. This proved quite difficult due to significant adherent calcified ligament. I carefully removed this to the maximal extent possible and was able to avoid a durotomy. Decompression was then taken out laterally into the bilateral foramina until no foraminal stenosis was palpable. A 83mm cortical allograft (Medtronic) was inserted into the disc space as an interbody graft.   This technique was then repeated at the C5-6 and C6-7 levels with 60mm cortical allograft interbodies at both levels.  An anterior plate (Medtronic) was positioned and 8, 60mm screws were used to secure the plate to the C4, C5, C6 and C7 vertebral bodies. Hemostasis was obtained and the incision was closed in layers. All instrument and sponge counts were correct. The patient was then returned to anesthesia for emergence. No apparent complications at the completion of the procedure.   EBL:  325mL   DRAINS: none   SPECIMENS: none   Judith Part, MD 05/07/21 1:39 PM

## 2021-05-07 NOTE — Transfer of Care (Signed)
2Immediate Anesthesia Transfer of Care Note  Patient: Monica [name]  Procedure(s) Performed: Cervical 4-5 Cervical 5-6 Cervical 6-7 Anterior cervical decompression/discectomy/fusion  Patient Location: PACU  Anesthesia Type:General  Level of Consciousness: awake and alert   Airway & Oxygen Therapy: Patient Spontanous Breathing and Patient connected to nasal cannula oxygen  Post-op Assessment: Report given to RN and Post -op Vital signs reviewed and stable  Post vital signs: Reviewed and stable  Last Vitals:  Vitals Value Taken Time  BP 129/54 05/07/21 1806  Temp 36.6 C 05/07/21 1806  Pulse 114 05/07/21 1813  Resp 21 05/07/21 1813  SpO2 100 % 05/07/21 1813  Vitals shown include unvalidated device data.  Last Pain:  Vitals:   05/07/21 1806  TempSrc:   PainSc: Asleep      Patients Stated Pain Goal: 3 (18/84/16 6063)  Complications: No notable events documented.

## 2021-05-07 NOTE — H&P (Signed)
Surgical H&P Update  HPI: 65 y.o. woman with progressive bilateral upper and lower extremity weakness, poor balance, consistent with cervical myelopathy. Workup showed severe canal stenosis with cord signal change. No changes in health since she was last seen, but has had a few more falls. Still having weakness and wishes to proceed with surgery.  PMHx:  Past Medical History:  Diagnosis Date   Anxiety    Asthma    COPD (chronic obstructive pulmonary disease) ([place])    Dyspnea    Heart murmur    09/07/20 echo ([facility]): LVEF 55%, mild AV sclerosis, mild AI, mild MR   Hepatitis C    History of drug abuse ([place])    Hypertension    Hypothyroidism    Pneumonia    Thyroid disease    FamHx:  Family History  Problem Relation Age of Onset   Lung cancer Mother    Heart disease Father    Hypertension Sister    Diabetes Maternal Grandmother    Stroke Maternal Grandmother    Colon cancer Neg Hx    SocHx:  reports that she has been smoking cigarettes. She has a 45.00 pack-year smoking history. She has never used smokeless tobacco. She reports that she does not currently use alcohol. She reports that she does not currently use drugs after having used the following drugs: Cocaine, Marijuana, and Methamphetamines.  Physical Exam: Strength 4/5 in BUE, 4-/5 in RLE, 4/5 in LLE x4, SILTx4 except bilateral stocking-glove numbness, +hoffman's bilaterally, inc'd reflxes in BUE/BLE   Assesment/Plan: 65 y.o. woman with severe cervical myelopathy, here for C4-5/C5-6/C6-7 ACDF. Risks, benefits, and alternatives discussed and the patient would like to continue with surgery.  -OR today -3C post-op  Judith Part, MD 05/07/21 1:04 PM

## 2021-05-08 DIAGNOSIS — M4802 Spinal stenosis, cervical region: Secondary | ICD-10-CM | POA: Diagnosis not present

## 2021-05-08 MED ORDER — OXYCODONE-ACETAMINOPHEN 7.5-325 MG PO TABS: 1.0000 | ORAL_TABLET | ORAL | 0 refills | Status: DC | PRN

## 2021-05-08 MED ORDER — OXYCODONE-ACETAMINOPHEN 7.5-325 MG PO TABS: 1.0000 | ORAL_TABLET | ORAL | Status: DC | PRN

## 2021-05-08 NOTE — Anesthesia Postprocedure Evaluation (Signed)
Anesthesia Post Note  Patient: Sehar Sedano Macho  Procedure(s) Performed: Cervical 4-5 Cervical 5-6 Cervical 6-7 Anterior cervical decompression/discectomy/fusion     Patient location during evaluation: PACU Anesthesia Type: General Level of consciousness: awake and alert Pain management: pain level controlled Vital Signs Assessment: post-procedure vital signs reviewed and stable Respiratory status: spontaneous breathing, nonlabored ventilation, respiratory function stable and patient connected to nasal cannula oxygen Cardiovascular status: blood pressure returned to baseline and stable Postop Assessment: no apparent nausea or vomiting Anesthetic complications: no   No notable events documented.  Last Vitals:  Vitals:   05/08/21 0321 05/08/21 0748  BP: 114/63 129/71  Pulse: 94 (!) 108  Resp: 18 18  Temp: 36.7 C 36.8 C  SpO2: 96% 98%    Last Pain:  Vitals:   05/08/21 0748  TempSrc: Oral  PainSc:                  Richardine Peppers L Yvanna Vidas

## 2021-05-08 NOTE — Discharge Summary (Signed)
Physician Discharge Summary  Patient ID: Monica [name] MRN: [id number] DOB/AGE: 63/07/1955 63 y.o.  Admit date: 05/07/2021 Discharge date: 05/08/2021  Admission Diagnoses: cervical myelopathy   Discharge Diagnoses: same   Discharged Condition: stable  Hospital Course: The patient was admitted on 05/07/2021 and taken to the operating room where the patient underwent ACDF. The patient tolerated the procedure well and was taken to the recovery room and then to the floor in stable condition. The hospital course was routine. There were no complications. The wound remained clean dry and intact. Pt had appropriate neck soreness. She has a new L C5 neve root dysfunction and [place] is aware.  The patient remained afebrile with stable vital signs, and tolerated a regular diet. The patient continued to increase activities, and pain was well controlled with oral pain medications.   Consults: None  Significant Diagnostic Studies:  Results for orders placed or performed during the hospital encounter of 05/07/21  SARS Coronavirus 2 by RT PCR (hospital order, performed in [facility] hospital lab)  Result Value Ref Range   SARS Coronavirus 2 NEGATIVE NEGATIVE  ABO/Rh  Result Value Ref Range   ABO/RH(D)      O POS Performed at [facility], [place] [address], [place], [place] [address]     DG Cervical Spine 2 or 3 views  Result Date: 05/07/2021 CLINICAL DATA:  Surgical anterior fusion of C4-5, C5-6 and C6-7. EXAM: CERVICAL SPINE - 2-3 VIEW; DG C-ARM 1-60 MIN-NO REPORT Radiation exposure index: 0.38 mGy. COMPARISON:  March 02, 2021. FINDINGS: Three intraoperative fluoroscopic images were obtained of the cervical spine. The patient is status post surgical anterior fusion of C4-5, C5-6 and C6-7. IMPRESSION: Fluoroscopic guidance provided during surgical anterior fusion of lower cervical spine. Electronically Signed   By: Marijo Conception M.D.   On: 05/07/2021 19:35   DG C-Arm 1-60 Min-No  Report  Result Date: 05/07/2021 Fluoroscopy was utilized by the requesting physician.  No radiographic interpretation.   DG C-Arm 1-60 Min-No Report  Result Date: 05/07/2021 Fluoroscopy was utilized by the requesting physician.  No radiographic interpretation.   DG C-Arm 1-60 Min-No Report  Result Date: 05/07/2021 Fluoroscopy was utilized by the requesting physician.  No radiographic interpretation.    Antibiotics:  Anti-infectives (From admission, onward)    Start     Dose/Rate Route Frequency Ordered Stop   05/07/21 2230  ceFAZolin (ANCEF) IVPB 2g/100 mL premix        2 g 200 mL/hr over 30 Minutes Intravenous Every 8 hours 05/07/21 1955 05/08/21 0546   05/07/21 1145  ceFAZolin (ANCEF) IVPB 2g/100 mL premix        2 g 200 mL/hr over 30 Minutes Intravenous On call to O.R. 05/07/21 1132 05/07/21 1430       Discharge Exam: Blood pressure 129/71, pulse (!) 108, temperature 98.3 F (36.8 C), temperature source Oral, resp. rate 18, height 5\' 1"  (1.549 m), weight 52.6 kg, SpO2 98 %. Neurologic: Grossly normal except L delt and bicep 1/5 [place] CDI  Discharge Medications:   Allergies as of 05/08/2021   No Known Allergies      Medication List     STOP taking these medications    HYDROcodone-acetaminophen 10-325 MG tablet Commonly known as: NORCO       TAKE these medications    amitriptyline 10 MG tablet Commonly known as: ELAVIL Take 10 mg by mouth at bedtime.   furosemide 20 MG tablet Commonly known as: LASIX Take 20 mg  by mouth 2 (two) times daily.   gabapentin 300 MG capsule Commonly known as: NEURONTIN TAKE ONE CAPSULE BY MOUTH THREE TIMES DAILY.   levothyroxine 75 MCG tablet Commonly known as: SYNTHROID TAKE ONE TABLET BY MOUTH DAILY BEFORE BREAKFAST. What changed: See the new instructions.   Magnesium 500 MG Tabs Take 500 mg by mouth in the morning.   methocarbamol 500 MG tablet Commonly known as: ROBAXIN Take 500 mg by mouth 3 (three) times  daily as needed for muscle spasms.   metoprolol succinate 25 MG 24 hr tablet Commonly known as: TOPROL-XL Take 12.5 mg by mouth daily after breakfast.   multivitamin with minerals Tabs tablet Take 1 tablet by mouth in the morning.   oxyCODONE-acetaminophen 7.5-325 MG tablet Commonly known as: PERCOCET Take 1 tablet by mouth every 4 (four) hours as needed for moderate pain.   Potassium 99 MG Tabs Take 99 mg by mouth in the morning.   ProAir HFA 108 (90 Base) MCG/ACT inhaler Generic drug: albuterol INHALE TWO PUFFS BY MOUTH EVERY 4 TO 6 HOURS AS NEEDED   Vitamin D3 125 MCG (5000 UT) Tabs Take 5,000 Units by mouth in the morning.        Disposition: home (I spoke with Dr Zada Finders)  Final Dx: ACDF, C5 root dysfunction  Discharge Instructions     Call MD for:  difficulty breathing, headache or visual disturbances   Complete by: As directed    Call MD for:  persistant nausea and vomiting   Complete by: As directed    Call MD for:  redness, tenderness, or signs of infection (pain, swelling, redness, odor or green/yellow discharge around incision site)   Complete by: As directed    Call MD for:  severe uncontrolled pain   Complete by: As directed    Call MD for:  temperature >100.4   Complete by: As directed    Diet - low sodium heart healthy   Complete by: As directed    Increase activity slowly   Complete by: As directed    No wound care   Complete by: As directed         Follow-up Information     Judith Part, MD. Schedule an appointment as soon as possible for a visit in 1 week(s).   Specialty: Neurosurgery Contact information: [place] [address] [phone]                  Signed: Eustace Moore 05/08/2021, 8:46 AM

## 2021-05-08 NOTE — Progress Notes (Signed)
Occupational Therapy Progress Note  Pt provided with HEP for Lt UE.  Reviewed safety and precautions with pt and caregiver.  She demonstrates poor carry over of precautions.   Recommend OPOT.    05/08/21 1050  OT Visit Information  Last OT Received On 05/08/21  Assistance Needed +1  History of Present Illness 65 y.o. woman admitted 05/07/21 with progressive bilateral upper and lower extremity weakness, poor balance, consistent with cervical myelopathy. Workup showed severe canal stenosis with cord signal change. PMH-COPD, Hep C, HTN, history of drug abuse  Precautions  Precautions Fall;Cervical  Precaution Booklet Issued Yes (comment)  Precaution Comments provided handout for neck precautions and care; reviewed entire handout with pt to emphasize education  Required Braces or Orthoses  (no brace per MD order)  Pain Assessment  Faces Pain Scale 4  Pain Location neck  Pain Descriptors / Indicators Operative site guarding  Cognition  Arousal/Alertness Awake/alert  Behavior During Therapy Restless;Impulsive  Overall Cognitive Status No family/caregiver present to determine baseline cognitive functioning  General Comments Pt with poor carry over of precautions, and appears to have poor health literacy.   She is very impulsive with impaired judgement and impaired problem solving - anticipate this may be her baseline  Upper Extremity Assessment  Upper Extremity Assessment RUE deficits/detail  RUE Deficits / Details generalized weakness noted  LUE Deficits / Details Pt with limited shoulder flexion ~50*.  Pt noted to use Lt UE more fluently and with better active movement during function than when asked to lift or move Lt UE.  She demonstrates elbow flexion ~3/5  LUE Coordination decreased gross motor;decreased fine motor  Lower Extremity Assessment  Lower Extremity Assessment Defer to PT evaluation  ADL  General ADL Comments Pt standing up in room packing up her belongings in preparation  for discharge.  she was noted to be rotating and flexing neck, lifing heavy bags, and walker was across her room.  Reinforced her precautions, as well as need for RW with her at all times.  Reinforced with her and caregiver that she is at high risk for falls and injury.  Caregiver stated pt will have 24 hour supervision at discharge.  Reviewed precautions with caregiver  Balance  Overall balance assessment Needs assistance;History of Falls  Sitting-balance support Feet supported  Sitting balance-Leahy Scale Good  Standing balance support During functional activity;Reliant on assistive device for balance  Standing balance-Leahy Scale Poor  Exercises  Exercises Other exercises  Other Exercises  Other Exercises Pt provided with medbridge HEP for elbow flex/ext and shoulder flexion and extension in supine.  Reviwed this with pt and caregiver, but pt hurried to leave so unable to have her return demonstration.  Other Exercises Pt provided with reacher and instructed in use.  Encouraged her to use this to pick up items from floor if needed  OT - End of Session  Activity Tolerance Patient tolerated treatment well  Patient left in chair;with call bell/phone within reach;with family/visitor present  Nurse Communication Mobility status  OT Assessment/Plan  OT Visit Diagnosis Unsteadiness on feet (R26.81)  OT Frequency (ACUTE ONLY) Min 2X/week  Follow Up Recommendations Outpatient OT  Assistance recommended at discharge Frequent or constant Supervision/Assistance  OT Equipment None recommended by OT  AM-PAC OT "6 Clicks" Daily Activity Outcome Measure (Version 2)  Help from another person eating meals? 4  Help from another person taking care of personal grooming? 3  Help from another person toileting, which includes using toliet, bedpan, or urinal? 3  Help from another person bathing (including washing, rinsing, drying)? 3  Help from another person to put on and taking off regular upper body  clothing? 3  Help from another person to put on and taking off regular lower body clothing? 3  6 Click Score 19  Progressive Mobility  What is the highest level of mobility based on the progressive mobility assessment? Level 4 (Walks with assist in room) - Balance while marching in place and cannot step forward and back - Complete  Mobility Out of bed to chair with meals;Out of bed for toileting;Ambulated with assistance in room  OT Goal Progression  Progress towards OT goals Progressing toward goals  OT Time Calculation  OT Start Time (ACUTE ONLY) 0949  OT Stop Time (ACUTE ONLY) 1003  OT Time Calculation (min) 14 min  OT General Charges  $OT Visit 1 Visit  OT Treatments  $Therapeutic Activity 8-22 mins  Nilsa Nutting., OTR/L Acute Rehabilitation Services Pager [phone] Office [phone]

## 2021-05-08 NOTE — Evaluation (Signed)
Physical Therapy Evaluation Patient Details Name: Monica [name] MRN: [id number] DOB: March 12, 1956 Today's Date: 05/08/2021  History of Present Illness  65 y.o. woman admitted 05/07/21 with progressive bilateral upper and lower extremity weakness, poor balance, consistent with cervical myelopathy. Workup showed severe canal stenosis with cord signal change. PMH-COPD, Hep C, HTN, history of drug abuse  Clinical Impression   Patient is s/p above surgery resulting in functional limitations due to the deficits listed below (see PT Problem List). Patient very anxious and in pain during session. PT session followed OT session and pt did show carryover of some information from OT sesssion and needed cues to maintain neck precautions and safety with RW.  Patient will benefit from skilled PT to increase their independence and safety with mobility to allow discharge to the venue listed below.  Patient would benefit from HHPT, however this is not an option with her insurance and therefore requesting OPPT.         Recommendations for follow up therapy are one component of a multi-disciplinary discharge planning process, led by the attending physician.  Recommendations may be updated based on patient status, additional functional criteria and insurance authorization.  Follow Up Recommendations Outpatient PT    Assistance Recommended at Discharge Frequent or constant Supervision/Assistance  Functional Status Assessment Patient has had a recent decline in their functional status and demonstrates the ability to make significant improvements in function in a reasonable and predictable amount of time.  Equipment Recommendations  Rolling walker (2 wheels)    Recommendations for Other Services OT consult     Precautions / Restrictions Precautions Precautions: Fall;Cervical Precaution Booklet Issued: Yes (comment) Precaution Comments: provided handout for neck precautions and care; reviewed entire handout with  pt to emphasize education Required Braces or Orthoses:  (no brace per MD order)      Mobility  Bed Mobility Overal bed mobility: Needs Assistance Bed Mobility: Rolling;Sidelying to Sit Rolling: Supervision Sidelying to sit: Supervision     Sit to sidelying: Supervision General bed mobility comments: pt instructed in log rolling    Transfers Overall transfer level: Needs assistance Equipment used: Rolling walker (2 wheels) Transfers: Sit to/from Stand Sit to Stand: Supervision           General transfer comment: vc for safe use of RW    Ambulation/Gait Ambulation/Gait assistance: Min guard Gait Distance (Feet): 45 Feet Assistive device: Rolling walker (2 wheels) Gait Pattern/deviations: Step-to pattern;Step-through pattern;Decreased stride length;Knee flexed in stance - right   Gait velocity interpretation: 1.31 - 2.62 ft/sec, indicative of limited community ambulator General Gait Details: initially leading with LLE and dragging RLE behind; educated on leading with RLE and required cues x 5 ft and then with good carryover and even progressed to step-through pattern naturally (without dragging RLE); cues for proximity to RW and safety during turning (pt recalled after cue x 1 to take smaller steps and smaller turns with RW from prior OT education).  Stairs            Wheelchair Mobility    Modified Rankin (Stroke Patients Only)       Balance Overall balance assessment: Needs assistance;History of Falls Sitting-balance support: Feet supported Sitting balance-Leahy Scale: Good     Standing balance support: During functional activity;Reliant on assistive device for balance Standing balance-Leahy Scale: Poor Standing balance comment: tends to balance on LLE only with RLE up on toe and behind her (ex. at sink on arrival) vc for placing RLE under her to improve balance with  good results                             Pertinent Vitals/Pain Pain  Assessment: Faces Faces Pain Scale: Hurts little more Pain Location: neck Pain Descriptors / Indicators: Operative site guarding Pain Intervention(s): Limited activity within patient's tolerance;Monitored during session;Patient requesting pain meds-RN notified    Home Living Family/patient expects to be discharged to:: Private residence Living Arrangements: Alone Available Help at Discharge: Family;Friend(s);Available 24 hours/day (per pt report) Type of Home: Apartment Home Access: Level entry       Home Layout: One level Home Equipment: Cane - single point;Shower seat;Grab bars - tub/shower;Grab bars - toilet Additional Comments: Pt reports her best friend will stay with her    Prior Function Prior Level of Function : Independent/Modified Independent             Mobility Comments: Pt reports she ambulated with cane PTA, but has h/o falls ADLs Comments: Pt reports she performs ADLs mod I     Hand Dominance   Dominant Hand: Right    Extremity/Trunk Assessment   Upper Extremity Assessment Upper Extremity Assessment: Defer to OT evaluation RUE Deficits / Details: generalized weakness noted LUE Deficits / Details: Pt with limited shoulder flexion ~50*.  Pt noted to use Lt UE more fluently and with better active movement during function than when asked to lift or move Lt UE.  She demonstrates elbow flexion ~3/5 LUE Coordination: decreased gross motor;decreased fine motor    Lower Extremity Assessment Lower Extremity Assessment: RLE deficits/detail RLE Deficits / Details: reports weakness PTA; unable to get foot flat with report of tightness in ankle (walks up on rt toes/forefoot). RLE: Unable to fully assess due to pain RLE Sensation: decreased light touch RLE Coordination: decreased gross motor    Cervical / Trunk Assessment Cervical / Trunk Assessment: Neck Surgery  Communication   Communication: No difficulties  Cognition Arousal/Alertness: Awake/alert Behavior  During Therapy: Restless;Impulsive Overall Cognitive Status: No family/caregiver present to determine baseline cognitive functioning                                 General Comments: Pt with poor carry over of precautions, and appears to have poor health literacy.   She is very impulsive with impaired judgement and impaired problem solving - anticipate this may be her baseline        General Comments General comments (skin integrity, edema, etc.): clonus noted Rt LE.    Exercises Other Exercises Other Exercises: Pt performed 5 reps each, shoulder flexion seated to ~50*, lap slides and elbow flex/ext   Assessment/Plan    PT Assessment Patient needs continued PT services  PT Problem List Decreased strength;Decreased range of motion;Decreased activity tolerance;Decreased balance;Decreased mobility;Decreased cognition;Decreased knowledge of use of DME;Decreased safety awareness;Decreased knowledge of precautions;Pain       PT Treatment Interventions DME instruction;Gait training;Functional mobility training;Therapeutic activities;Therapeutic exercise;Balance training;Cognitive remediation;Patient/family education    PT Goals (Current goals can be found in the Care Plan section)  Acute Rehab PT Goals Patient Stated Goal: go home today PT Goal Formulation: With patient Time For Goal Achievement: 05/22/21 Potential to Achieve Goals: Fair    Frequency Min 5X/week   Barriers to discharge   has family going to stay with her on discharge    Co-evaluation  AM-PAC PT "6 Clicks" Mobility  Outcome Measure Help needed turning from your back to your side while in a flat bed without using bedrails?: A Little Help needed moving from lying on your back to sitting on the side of a flat bed without using bedrails?: A Little Help needed moving to and from a bed to a chair (including a wheelchair)?: A Little Help needed standing up from a chair using your arms  (e.g., wheelchair or bedside chair)?: A Little Help needed to walk in hospital room?: A Little Help needed climbing 3-5 steps with a railing? : A Lot 6 Click Score: 17    End of Session Equipment Utilized During Treatment: Gait belt Activity Tolerance: Patient limited by pain Patient left: in bed;with call bell/phone within reach Nurse Communication: Mobility status;Patient requests pain meds;Other (comment) (needs RW and OPPT (cannot get HHPT due to insurance)) PT Visit Diagnosis: Difficulty in walking, not elsewhere classified (R26.2);Pain Pain - Right/Left:  (midline) Pain - part of body:  (neck)    Time: 0379-4446 PT Time Calculation (min) (ACUTE ONLY): 19 min   Charges:   PT Evaluation $PT Eval Moderate Complexity: [place], PT Acute Rehabilitation Services  Pager [phone] Office [phone]   Rexanne Mano 05/08/2021, 10:56 AM

## 2021-05-08 NOTE — Progress Notes (Signed)
Patient continue to complain of left arm, left shoulder and left leg pain which is new after surgery. Also noted limited movement in LUE- unable to lift arm above shoulder level, grip to left hand remains weak-moderate. Dr Zada Finders aware about patient's new symptoms and complaints and actually came in and checked patient in room last night for same concerns.Surgical site to anterior neck with noted swelling and bruising, soft to touch/palpation. Able to swallow meds and food without difficulty although c/o of throat soreness. Patient verbalized much concern about going home if she still cant move her left arm like it was and feeling a lot of pain. Will continue to monitor patient closely.

## 2021-05-08 NOTE — Evaluation (Signed)
Occupational Therapy Evaluation Patient Details Name: Monica [name] MRN: [id number] DOB: [birth year] Today's Date: 05/08/2021   History of Present Illness 65 y.o. woman admitted 05/07/21 with progressive bilateral upper and lower extremity weakness, poor balance, consistent with cervical myelopathy. Workup showed severe canal stenosis with cord signal change. PMH-COPD, Hep C, HTN, history of drug abuse   Clinical Impression   Pt admitted with above. She demonstrates the below listed deficits and will benefit from continued OT to maximize safety and independence with BADLs.  Pt presents to OT with Lt UE weakness, clonus Rt LE, impaired balance, and poor carry over and knowledge of precautions.  She requires supervision - min A for ADLs and functional mobility.  She reports she lives alone, but will have someone available to stay with her at discharge.        Recommendations for follow up therapy are one component of a multi-disciplinary discharge planning process, led by the attending physician.  Recommendations may be updated based on patient status, additional functional criteria and insurance authorization.   Follow Up Recommendations  Outpatient OT    Assistance Recommended at Discharge Frequent or constant Supervision/Assistance  Functional Status Assessment  Patient has had a recent decline in their functional status and demonstrates the ability to make significant improvements in function in a reasonable and predictable amount of time.  Equipment Recommendations  None recommended by OT    Recommendations for Other Services       Precautions / Restrictions Precautions Precautions: Fall;Cervical Precaution Booklet Issued: Yes (comment) Precaution Comments: Pt reports h/o falls      Mobility Bed Mobility Overal bed mobility: Needs Assistance Bed Mobility: Rolling;Sidelying to Sit Rolling: Supervision Sidelying to sit: Supervision       General bed mobility comments: pt  instructed in log rolling    Transfers                          Balance Overall balance assessment: Needs assistance;History of Falls Sitting-balance support: Feet supported Sitting balance-Leahy Scale: Good     Standing balance support: During functional activity;Reliant on assistive device for balance Standing balance-Leahy Scale: Poor                             ADL either performed or assessed with clinical judgement   ADL Overall ADL's : Needs assistance/impaired     Grooming: Wash/dry hands;Wash/dry face;Brushing hair;Min guard;Standing Grooming Details (indicate cue type and reason): reviewed safe method for grooming Upper Body Bathing: Supervision/ safety;Sitting   Lower Body Bathing: Min guard;Sit to/from stand   Upper Body Dressing : Supervision/safety;Sitting Upper Body Dressing Details (indicate cue type and reason): Pt attempted to doff shirt in standing.  Reinforced precautions and need to sit for UB ADLs Lower Body Dressing: Min guard;Sit to/from stand   Toilet Transfer: Ambulation;Comfort height toilet;BSC;Minimal assistance Toilet Transfer Details (indicate cue type and reason): required max cues for safe RW use and assist to control and maneuver RW when turning Toileting- Water quality scientist and Hygiene: Min guard;Sit to/from Nurse, children's Details (indicate cue type and reason): Pt instructed to not attempt tub transfer until OPOT has had opportunity to practice with her as she is an extremely high risk for falls/injury.  She verbalized understanding and reports she will sponge bathe Functional mobility during ADLs: Min guard;Rolling walker (2 wheels) General ADL Comments: Pt requires assist and mod - max  verbal cues for carry over of precautions and walker safety     Vision         Perception     Praxis      Pertinent Vitals/Pain Pain Assessment: Faces Faces Pain Scale: Hurts little more Pain Location:  neck Pain Descriptors / Indicators: Operative site guarding Pain Intervention(s): Monitored during session;Repositioned     Hand Dominance Right   Extremity/Trunk Assessment Upper Extremity Assessment Upper Extremity Assessment: RUE deficits/detail;LUE deficits/detail RUE Deficits / Details: generalized weakness noted LUE Deficits / Details: Pt with limited shoulder flexion ~50*.  Pt noted to use Lt UE more fluently and with better active movement during function than when asked to lift or move Lt UE.  She demonstrates elbow flexion ~3/5 LUE Coordination: decreased gross motor;decreased fine motor       Cervical / Trunk Assessment Cervical / Trunk Assessment: Neck Surgery   Communication Communication Communication: No difficulties   Cognition Arousal/Alertness: Awake/alert Behavior During Therapy: Restless;Impulsive Overall Cognitive Status: No family/caregiver present to determine baseline cognitive functioning                                 General Comments: Pt with poor carry over of precautions, and appears to have poor health literacy.   She is very impulsive with impaired judgement and impaired problem solving - anticipate this may be her baseline     General Comments  clonus noted Rt LE.    Exercises Exercises: Other exercises Other Exercises Other Exercises: Pt performed 5 reps each, shoulder flexion seated to ~50*, lap slides and elbow flex/ext   Shoulder Instructions      Home Living Family/patient expects to be discharged to:: Private residence Living Arrangements: Alone Available Help at Discharge: Family;Friend(s);Available 24 hours/day (per pt report) Type of Home: Apartment Home Access: Level entry     Home Layout: One level     Bathroom Shower/Tub: Tub/shower unit;Curtain   Bathroom Toilet: Handicapped height Bathroom Accessibility: Yes How Accessible: Accessible via walker Home Equipment: Superior - single point;Shower seat;Grab bars  - tub/shower;Grab bars - toilet   Additional Comments: Pt reports her best friend will stay with her      Prior Functioning/Environment Prior Level of Function : Independent/Modified Independent             Mobility Comments: Pt reports she ambulated with cane PTA, but has h/o falls ADLs Comments: Pt reports she performs ADLs mod I        OT Problem List: Decreased activity tolerance;Impaired balance (sitting and/or standing);Decreased cognition;Decreased safety awareness;Decreased knowledge of use of DME or AE;Decreased knowledge of precautions;Pain;Impaired UE functional use      OT Treatment/Interventions: Self-care/ADL training;Neuromuscular education;Therapeutic exercise;DME and/or AE instruction;Therapeutic activities;Patient/family education;Balance training    OT Goals(Current goals can be found in the care plan section) Acute Rehab OT Goals Patient Stated Goal: to go home today OT Goal Formulation: With patient Time For Goal Achievement: 05/14/21 Potential to Achieve Goals: Good ADL Goals Pt/caregiver will Perform Home Exercise Program: Increased ROM;Left upper extremity;With Supervision;With written HEP provided Additional ADL Goal #1: Pt will require no more than min cues for precautions during ADLs  OT Frequency: Min 2X/week   Barriers to D/C:            Co-evaluation              AM-PAC OT "6 Clicks" Daily Activity     Outcome Measure Help from  another person eating meals?: None Help from another person taking care of personal grooming?: A Little Help from another person toileting, which includes using toliet, bedpan, or urinal?: A Little Help from another person bathing (including washing, rinsing, drying)?: A Little Help from another person to put on and taking off regular upper body clothing?: A Little Help from another person to put on and taking off regular lower body clothing?: A Little 6 Click Score: 19   End of Session Equipment Utilized  During Treatment: Rolling walker (2 wheels) Nurse Communication: Mobility status  Activity Tolerance: Patient tolerated treatment well Patient left: in bed;with call bell/phone within reach;with bed alarm set  OT Visit Diagnosis: Unsteadiness on feet (R26.81)                Time: 7001-7494 OT Time Calculation (min): 28 min Charges:  OT General Charges $OT Visit: 1 Visit OT Evaluation $OT Eval Moderate Complexity: 1 Mod OT Treatments $Self Care/Home Management : 8-22 mins  Nilsa Nutting., OTR/L Acute Rehabilitation Services Pager [phone] Office [phone]   Lucille Passy M 05/08/2021, 10:30 AM

## 2021-05-08 NOTE — Progress Notes (Signed)
Patient is discharged from room 3C07 at this time. Alert and in stable condition. IV site d/c'd and instructions read to patient and nephew with understanding verbalized and all questions answered. Left unit via wheelchair with all belongings at side.

## 2021-05-10 ENCOUNTER — Encounter (HOSPITAL_COMMUNITY): Payer: Self-pay | Admitting: Neurological Surgery
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
## 2022-05-24 ENCOUNTER — Encounter: Payer: Self-pay | Admitting: Internal Medicine

## 2022-06-27 ENCOUNTER — Ambulatory Visit: Payer: Medicaid Other | Admitting: Gastroenterology

## 2022-07-15 IMAGING — RF DG CERVICAL SPINE 2 OR 3 VIEWS
1 series · 3 of 3 positions shown · non-contrast
Comparison: March 02, 2021.

CLINICAL DATA: Surgical anterior fusion of C4-5, C5-6 and C6-7.

EXAM:
CERVICAL SPINE - 2-3 VIEW; DG C-ARM 1-60 MIN-NO REPORT
Radiation exposure index: 0.38 mGy.

[Series 1: run · 3 of 3 slices shown]
[im 1/3]
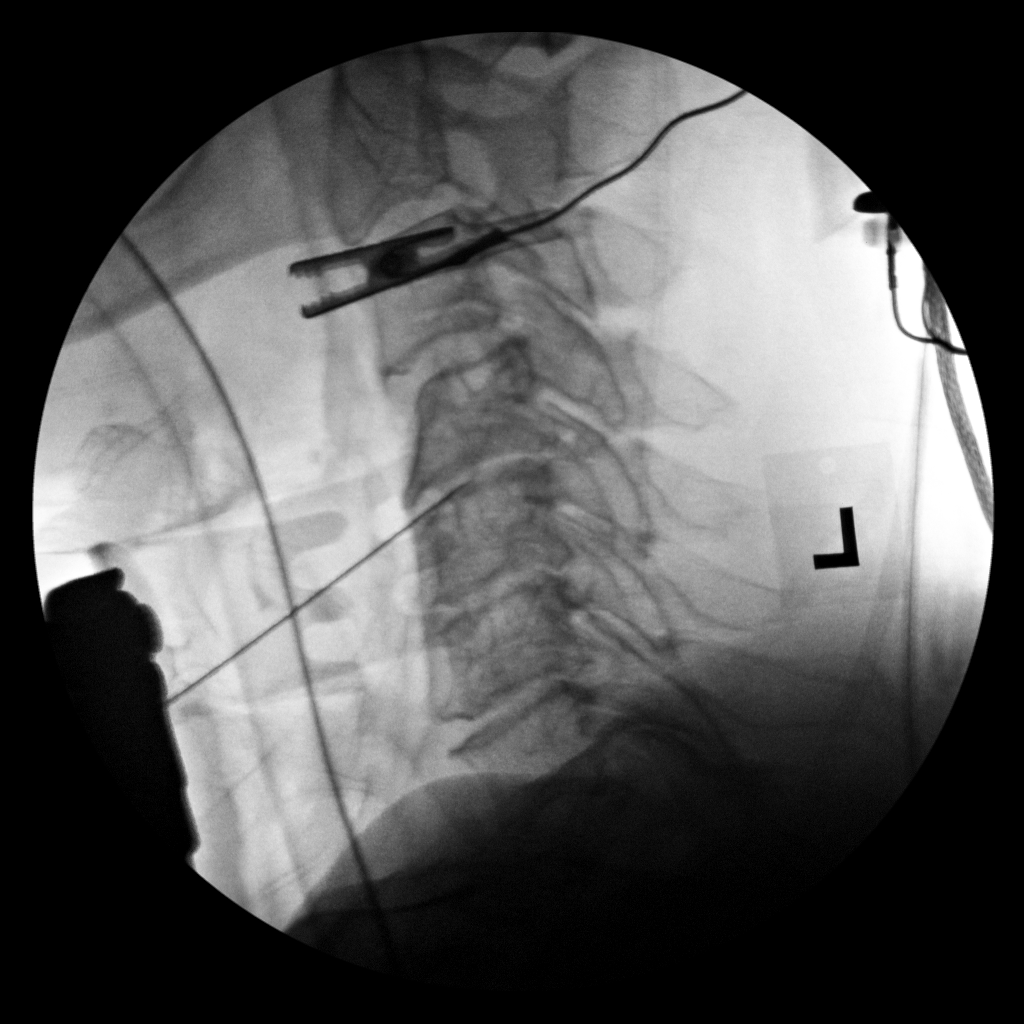
[im 2/3]
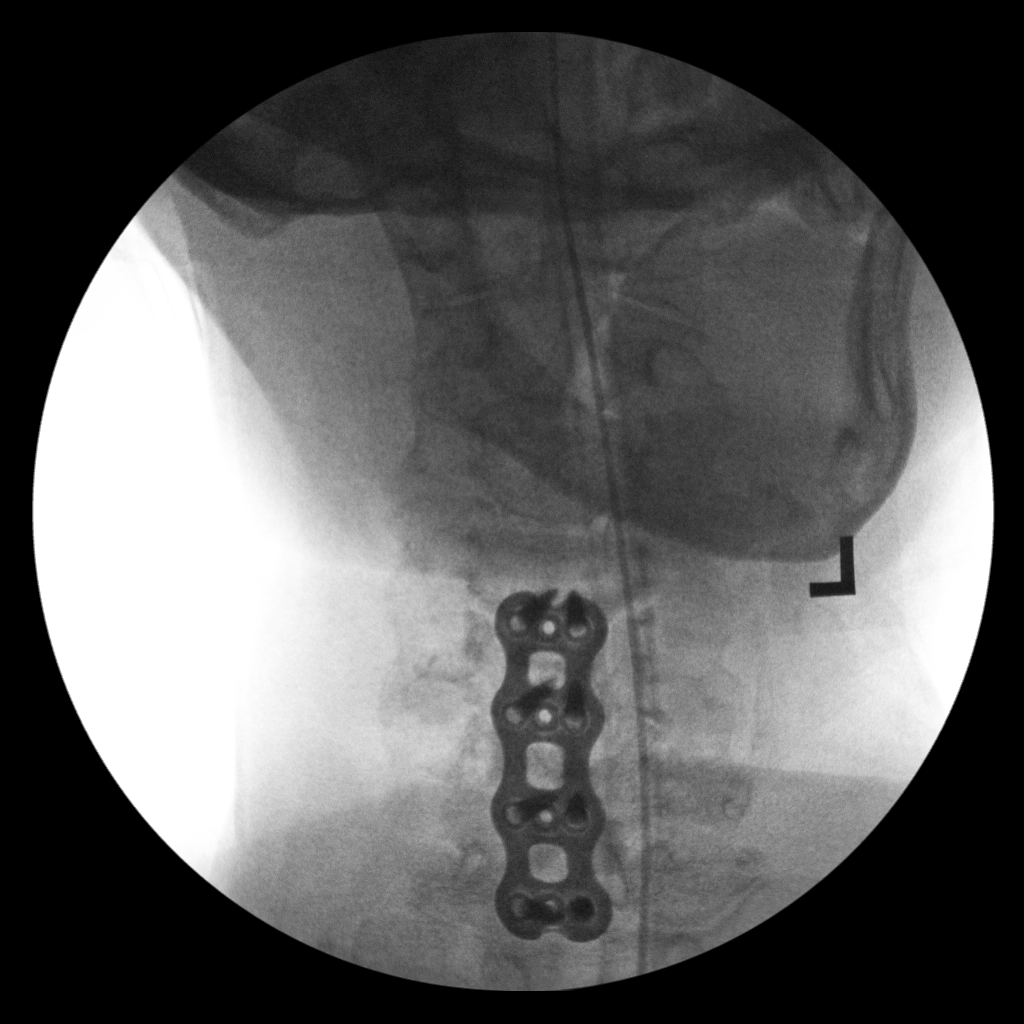
[im 3/3]
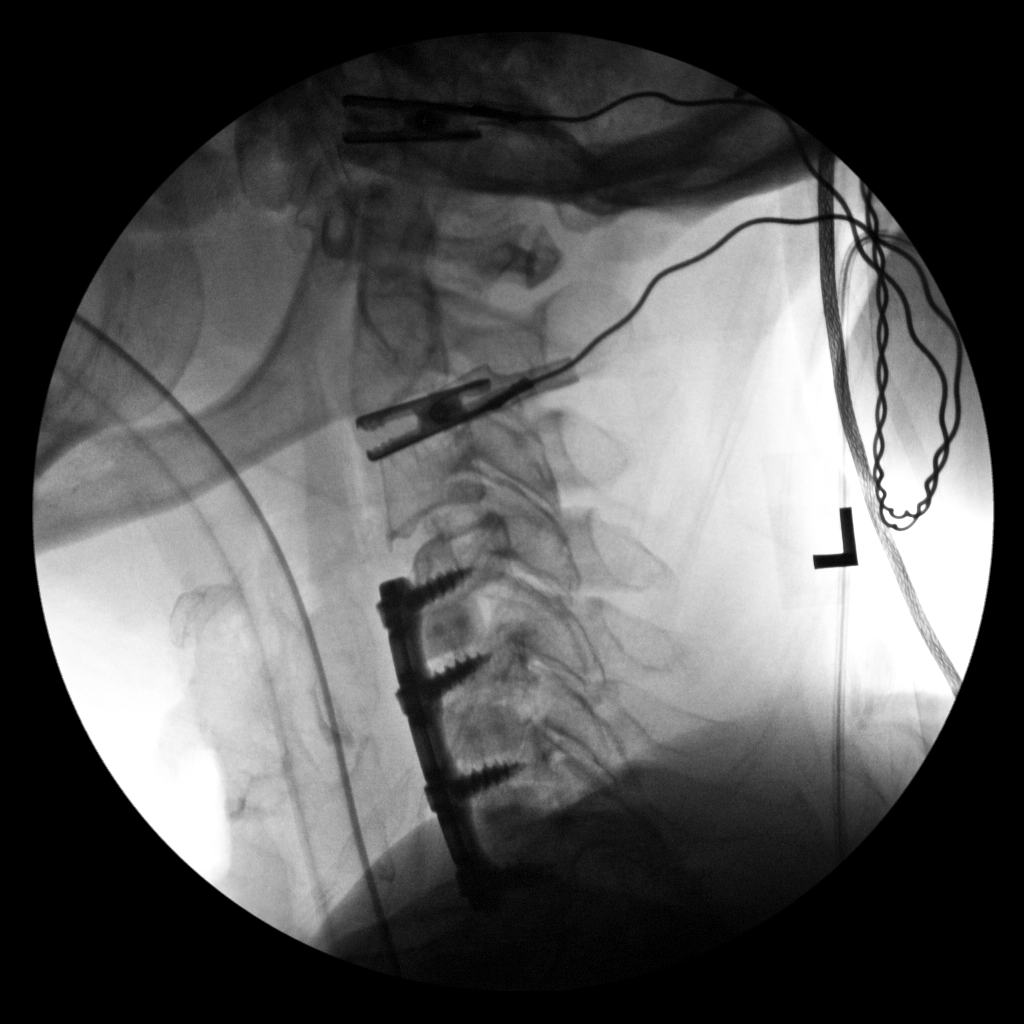

[3 of 3 positions shown; findings below may reference images not displayed]

FINDINGS: Three intraoperative fluoroscopic images were obtained of the
cervical spine. The patient is status post surgical anterior fusion
of C4-5, C5-6 and C6-7.
IMPRESSION: Fluoroscopic guidance provided during surgical anterior fusion of
lower cervical spine.

## 2022-09-16 DIAGNOSIS — F17210 Nicotine dependence, cigarettes, uncomplicated: Secondary | ICD-10-CM | POA: Diagnosis not present

## 2022-09-16 DIAGNOSIS — E079 Disorder of thyroid, unspecified: Secondary | ICD-10-CM | POA: Diagnosis not present

## 2022-09-16 DIAGNOSIS — R569 Unspecified convulsions: Secondary | ICD-10-CM | POA: Diagnosis not present

## 2022-09-16 DIAGNOSIS — F319 Bipolar disorder, unspecified: Secondary | ICD-10-CM | POA: Diagnosis not present

## 2022-09-16 DIAGNOSIS — N390 Urinary tract infection, site not specified: Secondary | ICD-10-CM | POA: Diagnosis not present

## 2022-09-16 DIAGNOSIS — R531 Weakness: Secondary | ICD-10-CM | POA: Diagnosis not present

## 2022-09-16 DIAGNOSIS — Z79899 Other long term (current) drug therapy: Secondary | ICD-10-CM | POA: Diagnosis not present

## 2023-01-10 DIAGNOSIS — J449 Chronic obstructive pulmonary disease, unspecified: Secondary | ICD-10-CM | POA: Diagnosis not present

## 2023-01-10 DIAGNOSIS — Z9049 Acquired absence of other specified parts of digestive tract: Secondary | ICD-10-CM | POA: Diagnosis not present

## 2023-01-10 DIAGNOSIS — Z1152 Encounter for screening for COVID-19: Secondary | ICD-10-CM | POA: Diagnosis not present

## 2023-01-10 DIAGNOSIS — T43621A Poisoning by amphetamines, accidental (unintentional), initial encounter: Secondary | ICD-10-CM | POA: Diagnosis not present

## 2023-01-10 DIAGNOSIS — R0689 Other abnormalities of breathing: Secondary | ICD-10-CM | POA: Diagnosis not present

## 2023-01-10 DIAGNOSIS — R9082 White matter disease, unspecified: Secondary | ICD-10-CM | POA: Diagnosis not present

## 2023-01-10 DIAGNOSIS — R0603 Acute respiratory distress: Secondary | ICD-10-CM | POA: Diagnosis not present

## 2023-01-10 DIAGNOSIS — R5381 Other malaise: Secondary | ICD-10-CM | POA: Diagnosis not present

## 2023-01-10 DIAGNOSIS — Z4789 Encounter for other orthopedic aftercare: Secondary | ICD-10-CM | POA: Diagnosis not present

## 2023-01-10 DIAGNOSIS — I700 Atherosclerosis of aorta: Secondary | ICD-10-CM | POA: Diagnosis not present

## 2023-01-10 DIAGNOSIS — Z7951 Long term (current) use of inhaled steroids: Secondary | ICD-10-CM | POA: Diagnosis not present

## 2023-01-10 DIAGNOSIS — G928 Other toxic encephalopathy: Secondary | ICD-10-CM | POA: Diagnosis not present

## 2023-01-10 DIAGNOSIS — K8020 Calculus of gallbladder without cholecystitis without obstruction: Secondary | ICD-10-CM | POA: Diagnosis not present

## 2023-01-10 DIAGNOSIS — J9621 Acute and chronic respiratory failure with hypoxia: Secondary | ICD-10-CM | POA: Diagnosis not present

## 2023-01-10 DIAGNOSIS — E039 Hypothyroidism, unspecified: Secondary | ICD-10-CM | POA: Diagnosis not present

## 2023-01-10 DIAGNOSIS — R053 Chronic cough: Secondary | ICD-10-CM | POA: Diagnosis not present

## 2023-01-10 DIAGNOSIS — K7689 Other specified diseases of liver: Secondary | ICD-10-CM | POA: Diagnosis not present

## 2023-01-10 DIAGNOSIS — F17200 Nicotine dependence, unspecified, uncomplicated: Secondary | ICD-10-CM | POA: Diagnosis not present

## 2023-01-10 DIAGNOSIS — N3289 Other specified disorders of bladder: Secondary | ICD-10-CM | POA: Diagnosis not present

## 2023-01-10 DIAGNOSIS — F17210 Nicotine dependence, cigarettes, uncomplicated: Secondary | ICD-10-CM | POA: Diagnosis not present

## 2023-01-10 DIAGNOSIS — M4854XA Collapsed vertebra, not elsewhere classified, thoracic region, initial encounter for fracture: Secondary | ICD-10-CM | POA: Diagnosis not present

## 2023-01-10 DIAGNOSIS — I10 Essential (primary) hypertension: Secondary | ICD-10-CM | POA: Diagnosis not present

## 2023-01-10 DIAGNOSIS — K8010 Calculus of gallbladder with chronic cholecystitis without obstruction: Secondary | ICD-10-CM | POA: Diagnosis not present

## 2023-01-10 DIAGNOSIS — K8000 Calculus of gallbladder with acute cholecystitis without obstruction: Secondary | ICD-10-CM | POA: Diagnosis not present

## 2023-01-10 DIAGNOSIS — E869 Volume depletion, unspecified: Secondary | ICD-10-CM | POA: Diagnosis not present

## 2023-01-10 DIAGNOSIS — K828 Other specified diseases of gallbladder: Secondary | ICD-10-CM | POA: Diagnosis not present

## 2023-01-10 DIAGNOSIS — J439 Emphysema, unspecified: Secondary | ICD-10-CM | POA: Diagnosis not present

## 2023-01-10 DIAGNOSIS — K8012 Calculus of gallbladder with acute and chronic cholecystitis without obstruction: Secondary | ICD-10-CM | POA: Diagnosis not present

## 2023-01-10 DIAGNOSIS — J9811 Atelectasis: Secondary | ICD-10-CM | POA: Diagnosis not present

## 2023-01-10 DIAGNOSIS — G9340 Encephalopathy, unspecified: Secondary | ICD-10-CM | POA: Diagnosis not present

## 2023-01-10 DIAGNOSIS — R7989 Other specified abnormal findings of blood chemistry: Secondary | ICD-10-CM | POA: Diagnosis not present

## 2023-01-10 DIAGNOSIS — R000 Tachycardia, unspecified: Secondary | ICD-10-CM | POA: Diagnosis not present

## 2023-01-10 DIAGNOSIS — R918 Other nonspecific abnormal finding of lung field: Secondary | ICD-10-CM | POA: Diagnosis not present

## 2023-01-10 DIAGNOSIS — R0902 Hypoxemia: Secondary | ICD-10-CM | POA: Diagnosis not present

## 2023-01-10 DIAGNOSIS — K838 Other specified diseases of biliary tract: Secondary | ICD-10-CM | POA: Diagnosis not present

## 2023-01-10 DIAGNOSIS — R350 Frequency of micturition: Secondary | ICD-10-CM | POA: Diagnosis not present

## 2023-01-10 DIAGNOSIS — K7460 Unspecified cirrhosis of liver: Secondary | ICD-10-CM | POA: Diagnosis not present

## 2023-01-10 DIAGNOSIS — K829 Disease of gallbladder, unspecified: Secondary | ICD-10-CM | POA: Diagnosis not present

## 2023-01-10 DIAGNOSIS — R0600 Dyspnea, unspecified: Secondary | ICD-10-CM | POA: Diagnosis not present

## 2023-01-10 DIAGNOSIS — E876 Hypokalemia: Secondary | ICD-10-CM | POA: Diagnosis not present

## 2023-01-10 DIAGNOSIS — R1084 Generalized abdominal pain: Secondary | ICD-10-CM | POA: Diagnosis not present

## 2023-01-11 DIAGNOSIS — I700 Atherosclerosis of aorta: Secondary | ICD-10-CM | POA: Diagnosis not present

## 2023-01-11 DIAGNOSIS — J9811 Atelectasis: Secondary | ICD-10-CM | POA: Diagnosis not present

## 2023-01-11 DIAGNOSIS — J439 Emphysema, unspecified: Secondary | ICD-10-CM | POA: Diagnosis not present

## 2023-01-11 DIAGNOSIS — R053 Chronic cough: Secondary | ICD-10-CM | POA: Diagnosis not present

## 2023-01-16 DIAGNOSIS — J441 Chronic obstructive pulmonary disease with (acute) exacerbation: Secondary | ICD-10-CM | POA: Diagnosis not present

## 2023-01-16 DIAGNOSIS — J9621 Acute and chronic respiratory failure with hypoxia: Secondary | ICD-10-CM | POA: Diagnosis not present

## 2023-02-16 DIAGNOSIS — J441 Chronic obstructive pulmonary disease with (acute) exacerbation: Secondary | ICD-10-CM | POA: Diagnosis not present

## 2023-02-16 DIAGNOSIS — J9621 Acute and chronic respiratory failure with hypoxia: Secondary | ICD-10-CM | POA: Diagnosis not present

## 2023-02-17 ENCOUNTER — Encounter: Payer: Self-pay | Admitting: Internal Medicine

## 2023-02-22 DIAGNOSIS — E039 Hypothyroidism, unspecified: Secondary | ICD-10-CM | POA: Diagnosis not present

## 2023-02-22 DIAGNOSIS — G894 Chronic pain syndrome: Secondary | ICD-10-CM | POA: Diagnosis not present

## 2023-02-22 DIAGNOSIS — J432 Centrilobular emphysema: Secondary | ICD-10-CM | POA: Diagnosis not present

## 2023-02-22 DIAGNOSIS — M21371 Foot drop, right foot: Secondary | ICD-10-CM | POA: Diagnosis not present

## 2023-02-22 DIAGNOSIS — I700 Atherosclerosis of aorta: Secondary | ICD-10-CM | POA: Diagnosis not present

## 2023-03-02 DIAGNOSIS — E039 Hypothyroidism, unspecified: Secondary | ICD-10-CM | POA: Diagnosis not present

## 2023-03-02 DIAGNOSIS — A419 Sepsis, unspecified organism: Secondary | ICD-10-CM | POA: Diagnosis not present

## 2023-03-02 DIAGNOSIS — R7401 Elevation of levels of liver transaminase levels: Secondary | ICD-10-CM | POA: Diagnosis not present

## 2023-03-02 DIAGNOSIS — Z79899 Other long term (current) drug therapy: Secondary | ICD-10-CM | POA: Diagnosis not present

## 2023-03-02 DIAGNOSIS — N390 Urinary tract infection, site not specified: Secondary | ICD-10-CM | POA: Diagnosis not present

## 2023-03-02 DIAGNOSIS — I10 Essential (primary) hypertension: Secondary | ICD-10-CM | POA: Diagnosis not present

## 2023-03-02 DIAGNOSIS — E876 Hypokalemia: Secondary | ICD-10-CM | POA: Diagnosis not present

## 2023-03-02 DIAGNOSIS — K769 Liver disease, unspecified: Secondary | ICD-10-CM | POA: Diagnosis not present

## 2023-03-02 DIAGNOSIS — J449 Chronic obstructive pulmonary disease, unspecified: Secondary | ICD-10-CM | POA: Diagnosis not present

## 2023-03-02 DIAGNOSIS — F17210 Nicotine dependence, cigarettes, uncomplicated: Secondary | ICD-10-CM | POA: Diagnosis not present

## 2023-03-02 DIAGNOSIS — G9341 Metabolic encephalopathy: Secondary | ICD-10-CM | POA: Diagnosis not present

## 2023-03-02 DIAGNOSIS — Z7951 Long term (current) use of inhaled steroids: Secondary | ICD-10-CM | POA: Diagnosis not present

## 2023-03-09 DIAGNOSIS — Z515 Encounter for palliative care: Secondary | ICD-10-CM | POA: Diagnosis not present

## 2023-03-09 DIAGNOSIS — K7460 Unspecified cirrhosis of liver: Secondary | ICD-10-CM | POA: Diagnosis not present

## 2023-03-15 DIAGNOSIS — E039 Hypothyroidism, unspecified: Secondary | ICD-10-CM | POA: Diagnosis not present

## 2023-03-15 DIAGNOSIS — A09 Infectious gastroenteritis and colitis, unspecified: Secondary | ICD-10-CM | POA: Diagnosis not present

## 2023-03-19 DIAGNOSIS — J441 Chronic obstructive pulmonary disease with (acute) exacerbation: Secondary | ICD-10-CM | POA: Diagnosis not present

## 2023-03-19 DIAGNOSIS — J9621 Acute and chronic respiratory failure with hypoxia: Secondary | ICD-10-CM | POA: Diagnosis not present

## 2023-03-20 ENCOUNTER — Ambulatory Visit: Payer: 59 | Admitting: Gastroenterology

## 2023-03-20 ENCOUNTER — Encounter: Payer: Self-pay | Admitting: Gastroenterology

## 2023-03-20 NOTE — Progress Notes (Deleted)
GI Office Note    Referring Provider: Toma Deiters, MD Primary Care Physician:  Toma Deiters, MD  Primary Gastroenterologist: ***  Chief Complaint   No chief complaint on file.  History of Present Illness   FALISA MARCUCCI is a 67 y.o. female presenting today at the request of Hasanaj, Myra Gianotti, MD/Dr. Marcha Solders for concern for alcoholic cirrhosis.  Colonoscopy September 2019: -Internal hemorrhoids -Single-family or polyp: Tubular adenoma -Recommended she continue Linzess and Anusol cream twice daily -Repeat colonoscopy in 7 years  Previously scheduled for follow-up in February 2020 and patient was not seen.  Per review of this deleted note she has a history of constipation, hep C, and rectal bleeding.  She had a history of hepatitis C with positive RNA confirming 17 million copies.  She was negative for hepatitis A and B as well as HIV.  She had elevated liver enzymes at that time.  She had an ultrasound with [facility] noted cholelithiasis without cholecystitis and 2018.  She does have a drug history but reportedly was abstaining at the time.  CT A/P 01/10/2023: -Mildly distended urinary bladder -Markedly distended gallbladder with cholelithiasis  RUQ [place] 01/10/2023: -   Recent labs 03/04/2023: Sodium 142, potassium 3.9, BUN 5, creatinine 0.63, albumin 3.4, T. bili 0.8, AST 75, ALT 59, alk phos 78.  Hemoglobin 13.1, platelets 155.  Urine drug screen positive for benzodiazepines.  Ammonia level mildly elevated on 03/02/2023.     Current Outpatient Medications  Medication Sig Dispense Refill   amitriptyline (ELAVIL) 10 MG tablet Take 10 mg by mouth at bedtime.     Cholecalciferol (VITAMIN D3) 125 MCG (5000 UT) TABS Take 5,000 Units by mouth in the morning.     furosemide (LASIX) 20 MG tablet Take 20 mg by mouth 2 (two) times daily.     gabapentin (NEURONTIN) 300 MG capsule TAKE ONE CAPSULE BY MOUTH THREE TIMES DAILY. (Patient taking differently: Take 300 mg by mouth 3 (three) times  daily.) 90 capsule 0   levothyroxine (SYNTHROID, LEVOTHROID) 75 MCG tablet TAKE ONE TABLET BY MOUTH DAILY BEFORE BREAKFAST. (Patient taking differently: Take 75 mcg by mouth daily before breakfast.) 30 tablet 3   Magnesium 500 MG TABS Take 500 mg by mouth in the morning.     methocarbamol (ROBAXIN) 500 MG tablet Take 500 mg by mouth 3 (three) times daily as needed for muscle spasms.     metoprolol succinate (TOPROL-XL) 25 MG 24 hr tablet Take 12.5 mg by mouth daily after breakfast.     Multiple Vitamin (MULTIVITAMIN WITH MINERALS) TABS tablet Take 1 tablet by mouth in the morning.     oxyCODONE-acetaminophen (PERCOCET) 7.5-325 MG tablet Take 1 tablet by mouth every 4 (four) hours as needed for moderate pain. 30 tablet 0   Potassium 99 MG TABS Take 99 mg by mouth in the morning.     PROAIR HFA 108 (90 Base) MCG/ACT inhaler INHALE TWO PUFFS BY MOUTH EVERY 4 TO 6 HOURS AS NEEDED 8.5 g 2   No current facility-administered medications for this visit.    Past Medical History:  Diagnosis Date   Anxiety    Asthma    COPD (chronic obstructive pulmonary disease) (HCC)    Dyspnea    Heart murmur    09/07/20 echo ([facility]): LVEF 55%, mild AV sclerosis, mild AI, mild MR   Hepatitis C    History of drug abuse (HCC)    Hypertension    Hypothyroidism  Pneumonia    Thyroid disease     Past Surgical History:  Procedure Laterality Date   ANTERIOR CERVICAL DECOMP/DISCECTOMY FUSION N/A 05/07/2021   Procedure: Cervical 4-5 Cervical 5-6 Cervical 6-7 Anterior cervical decompression/discectomy/fusion;  Surgeon: Jadene Pierini, MD;  [facility];  Service: Neurosurgery;  Laterality: N/A;   COLONOSCOPY WITH PROPOFOL N/A 03/26/2018   Procedure: COLONOSCOPY WITH PROPOFOL;  Surgeon: Corbin Ade, MD;  [facility];  Service: Endoscopy;  Laterality: N/A;  11:15am   POLYPECTOMY  03/26/2018   Procedure: POLYPECTOMY;  Surgeon: Corbin Ade, MD;  [facility];  Service:  Endoscopy;;  splenic flexure   TONSILLECTOMY     as a child   TUBAL LIGATION      Family History  Problem Relation Age of Onset   Lung cancer Mother    Heart disease Father    Hypertension Sister    Diabetes Maternal Grandmother    Stroke Maternal Grandmother    Colon cancer Neg Hx     Allergies as of 03/20/2023   (No Known Allergies)    Social History   Socioeconomic History   Marital status: Divorced    Spouse name: Not on file   Number of children: Not on file   Years of education: Not on file   Highest education level: Not on file  Occupational History   Not on file  Tobacco Use   Smoking status: Every Day    Current packs/day: 1.00    Average packs/day: 1 pack/day for 45.0 years (45.0 ttl pk-yrs)    Types: Cigarettes   Smokeless tobacco: Never  Vaping Use   Vaping status: Some Days  Substance and Sexual Activity   Alcohol use: Not Currently   Drug use: Not Currently    Types: Cocaine, Marijuana, Methamphetamines    Comment: last used 4 months ago- sts 03/23/2018.   Sexual activity: Not Currently    Birth control/protection: Surgical  Other Topics Concern   Not on file  Social History Narrative   Has two children. Divorced. Lives alone in [place]. Worked Holiday representative. Smokes cigarettes. Eats all food groups.    Social Determinants of Health   Financial Resource Strain: Low Risk  (03/03/2023)   Received from [facility]   Overall Financial Resource Strain (CARDIA)    Difficulty of Paying Living Expenses: Not hard at all  Food Insecurity: No Food Insecurity (03/03/2023)   Received from [facility]   Hunger Vital Sign    Worried About Running Out of Food in the Last Year: Never true    Ran Out of Food in the Last Year: Never true  Transportation Needs: No Transportation Needs (03/03/2023)   Received from [facility] - Transportation    Lack of Transportation (Medical): No    Lack of Transportation (Non-Medical): No  Physical Activity:  Sufficiently Active (05/25/2021)   Received from [facility], [facility]   Exercise Vital Sign    Days of Exercise per Week: 7 days    Minutes of Exercise per Session: 30 min  Stress: No Stress Concern Present (05/25/2021)   Received from [facility], [facility] of Occupational [facility] - Occupational Stress Questionnaire    Feeling of Stress : Only a little  Social Connections: Moderately Integrated (05/25/2021)   Received from [facility], [facility]   Social Connection and Isolation Panel [NHANES]    Frequency of Communication  with Friends and Family: More than three times a week    Frequency of Social Gatherings with Friends and Family: More than three times a week    Attends Religious Services: More than 4 times per year    Active Member of [employer] or Organizations: Yes    Attends Banker Meetings: 1 to 4 times per year    Marital Status: Divorced  Catering manager Violence: Not At Risk (03/03/2023)   Received from [facility]   Humiliation, Afraid, Rape, and Kick questionnaire    Fear of Current or Ex-Partner: No    Emotionally Abused: No    Physically Abused: No    Sexually Abused: No     Review of Systems   Gen: Denies any fever, chills, fatigue, weight loss, lack of appetite.  CV: Denies chest pain, heart palpitations, peripheral edema, syncope.  Resp: Denies shortness of breath at rest or with exertion. Denies wheezing or cough.  GI: see HPI GU : Denies urinary burning, urinary frequency, urinary hesitancy MS: Denies joint pain, muscle weakness, cramps, or limitation of movement.  Derm: Denies rash, itching, dry skin Psych: Denies depression, anxiety, memory loss, and confusion Heme: Denies bruising, bleeding, and enlarged lymph nodes.   Physical Exam   There were no vitals taken for this visit.  General:   Alert and oriented. Pleasant and cooperative. Well-nourished and well-developed.  Head:   Normocephalic and atraumatic. Eyes:  Without icterus, sclera clear and conjunctiva pink.  Ears:  Normal auditory acuity. Mouth:  No deformity or lesions, oral mucosa pink.  Lungs:  Clear to auscultation bilaterally. No wheezes, rales, or rhonchi. No distress.  Heart:  S1, S2 present without murmurs appreciated.  Abdomen:  +BS, soft, non-tender and non-distended. No HSM noted. No guarding or rebound. No masses appreciated.  Rectal:  Deferred  Msk:  Symmetrical without gross deformities. Normal posture. Extremities:  Without edema. Neurologic:  Alert and  oriented x4;  grossly normal neurologically. Skin:  Intact without significant lesions or rashes. Psych:  Alert and cooperative. Normal mood and affect.   Assessment   CATALAYA GALLARDO is a 67 y.o. female with a history of *** presenting today with      PLAN   ***    Brooke Bonito, MSN, FNP-BC, AGACNP-BC [facility]

## 2023-03-27 DIAGNOSIS — Z515 Encounter for palliative care: Secondary | ICD-10-CM | POA: Diagnosis not present

## 2023-03-27 DIAGNOSIS — K7460 Unspecified cirrhosis of liver: Secondary | ICD-10-CM | POA: Diagnosis not present

## 2023-04-12 DIAGNOSIS — L299 Pruritus, unspecified: Secondary | ICD-10-CM | POA: Diagnosis not present

## 2023-04-12 DIAGNOSIS — G894 Chronic pain syndrome: Secondary | ICD-10-CM | POA: Diagnosis not present

## 2023-04-18 DIAGNOSIS — J441 Chronic obstructive pulmonary disease with (acute) exacerbation: Secondary | ICD-10-CM | POA: Diagnosis not present

## 2023-04-18 DIAGNOSIS — J9621 Acute and chronic respiratory failure with hypoxia: Secondary | ICD-10-CM | POA: Diagnosis not present

## 2023-05-04 DIAGNOSIS — I700 Atherosclerosis of aorta: Secondary | ICD-10-CM | POA: Diagnosis not present

## 2023-05-05 DIAGNOSIS — K7460 Unspecified cirrhosis of liver: Secondary | ICD-10-CM | POA: Diagnosis not present

## 2023-05-05 DIAGNOSIS — Z515 Encounter for palliative care: Secondary | ICD-10-CM | POA: Diagnosis not present

## 2023-05-19 DIAGNOSIS — J441 Chronic obstructive pulmonary disease with (acute) exacerbation: Secondary | ICD-10-CM | POA: Diagnosis not present

## 2023-05-19 DIAGNOSIS — J9621 Acute and chronic respiratory failure with hypoxia: Secondary | ICD-10-CM | POA: Diagnosis not present

## 2023-06-07 DIAGNOSIS — R0989 Other specified symptoms and signs involving the circulatory and respiratory systems: Secondary | ICD-10-CM | POA: Diagnosis not present

## 2023-06-07 DIAGNOSIS — J984 Other disorders of lung: Secondary | ICD-10-CM | POA: Diagnosis not present

## 2023-06-07 DIAGNOSIS — J069 Acute upper respiratory infection, unspecified: Secondary | ICD-10-CM | POA: Diagnosis not present

## 2023-06-07 DIAGNOSIS — E079 Disorder of thyroid, unspecified: Secondary | ICD-10-CM | POA: Diagnosis not present

## 2023-06-07 DIAGNOSIS — R918 Other nonspecific abnormal finding of lung field: Secondary | ICD-10-CM | POA: Diagnosis not present

## 2023-06-07 DIAGNOSIS — R000 Tachycardia, unspecified: Secondary | ICD-10-CM | POA: Diagnosis not present

## 2023-06-07 DIAGNOSIS — Z79899 Other long term (current) drug therapy: Secondary | ICD-10-CM | POA: Diagnosis not present

## 2023-06-07 DIAGNOSIS — E860 Dehydration: Secondary | ICD-10-CM | POA: Diagnosis not present

## 2023-06-07 DIAGNOSIS — J439 Emphysema, unspecified: Secondary | ICD-10-CM | POA: Diagnosis not present

## 2023-06-07 DIAGNOSIS — I700 Atherosclerosis of aorta: Secondary | ICD-10-CM | POA: Diagnosis not present

## 2023-06-07 DIAGNOSIS — I10 Essential (primary) hypertension: Secondary | ICD-10-CM | POA: Diagnosis not present

## 2023-06-07 DIAGNOSIS — Z4789 Encounter for other orthopedic aftercare: Secondary | ICD-10-CM | POA: Diagnosis not present

## 2023-06-07 DIAGNOSIS — F17210 Nicotine dependence, cigarettes, uncomplicated: Secondary | ICD-10-CM | POA: Diagnosis not present

## 2023-06-18 DIAGNOSIS — J9621 Acute and chronic respiratory failure with hypoxia: Secondary | ICD-10-CM | POA: Diagnosis not present

## 2023-06-18 DIAGNOSIS — J441 Chronic obstructive pulmonary disease with (acute) exacerbation: Secondary | ICD-10-CM | POA: Diagnosis not present

## 2023-06-22 DIAGNOSIS — E039 Hypothyroidism, unspecified: Secondary | ICD-10-CM | POA: Diagnosis not present

## 2023-06-22 DIAGNOSIS — I700 Atherosclerosis of aorta: Secondary | ICD-10-CM | POA: Diagnosis not present

## 2023-06-22 DIAGNOSIS — G894 Chronic pain syndrome: Secondary | ICD-10-CM | POA: Diagnosis not present

## 2023-07-19 DIAGNOSIS — J9621 Acute and chronic respiratory failure with hypoxia: Secondary | ICD-10-CM | POA: Diagnosis not present

## 2023-07-19 DIAGNOSIS — J441 Chronic obstructive pulmonary disease with (acute) exacerbation: Secondary | ICD-10-CM | POA: Diagnosis not present

## 2023-08-19 DIAGNOSIS — J441 Chronic obstructive pulmonary disease with (acute) exacerbation: Secondary | ICD-10-CM | POA: Diagnosis not present

## 2023-08-19 DIAGNOSIS — J9621 Acute and chronic respiratory failure with hypoxia: Secondary | ICD-10-CM | POA: Diagnosis not present

## 2023-08-23 DIAGNOSIS — R531 Weakness: Secondary | ICD-10-CM | POA: Diagnosis not present

## 2023-08-23 DIAGNOSIS — Z9981 Dependence on supplemental oxygen: Secondary | ICD-10-CM | POA: Diagnosis not present

## 2023-08-23 DIAGNOSIS — R0600 Dyspnea, unspecified: Secondary | ICD-10-CM | POA: Diagnosis not present

## 2023-08-23 DIAGNOSIS — K7460 Unspecified cirrhosis of liver: Secondary | ICD-10-CM | POA: Diagnosis not present

## 2023-08-23 DIAGNOSIS — R5383 Other fatigue: Secondary | ICD-10-CM | POA: Diagnosis not present

## 2023-08-23 DIAGNOSIS — Z515 Encounter for palliative care: Secondary | ICD-10-CM | POA: Diagnosis not present

## 2023-08-23 DIAGNOSIS — J449 Chronic obstructive pulmonary disease, unspecified: Secondary | ICD-10-CM | POA: Diagnosis not present

## 2023-09-06 DIAGNOSIS — E079 Disorder of thyroid, unspecified: Secondary | ICD-10-CM | POA: Diagnosis not present

## 2023-09-06 DIAGNOSIS — I10 Essential (primary) hypertension: Secondary | ICD-10-CM | POA: Diagnosis not present

## 2023-09-06 DIAGNOSIS — N390 Urinary tract infection, site not specified: Secondary | ICD-10-CM | POA: Diagnosis not present

## 2023-09-06 DIAGNOSIS — F17210 Nicotine dependence, cigarettes, uncomplicated: Secondary | ICD-10-CM | POA: Diagnosis not present

## 2023-09-06 DIAGNOSIS — E860 Dehydration: Secondary | ICD-10-CM | POA: Diagnosis not present

## 2023-09-06 DIAGNOSIS — Z79899 Other long term (current) drug therapy: Secondary | ICD-10-CM | POA: Diagnosis not present

## 2023-10-31 ENCOUNTER — Encounter: Payer: Self-pay | Admitting: *Deleted

## 2024-01-18 ENCOUNTER — Encounter: Payer: Self-pay | Admitting: Gastroenterology

## 2024-01-30 ENCOUNTER — Encounter: Payer: Self-pay | Admitting: Gastroenterology

## 2024-01-30 ENCOUNTER — Ambulatory Visit (INDEPENDENT_AMBULATORY_CARE_PROVIDER_SITE_OTHER): Admitting: Gastroenterology

## 2024-01-30 VITALS — BP 114/56 | HR 76 | Temp 98.6°F | Ht 61.0 in | Wt 110.2 lb

## 2024-01-30 DIAGNOSIS — B182 Chronic viral hepatitis C: Secondary | ICD-10-CM

## 2024-01-30 DIAGNOSIS — R109 Unspecified abdominal pain: Secondary | ICD-10-CM | POA: Insufficient documentation

## 2024-01-30 DIAGNOSIS — R7989 Other specified abnormal findings of blood chemistry: Secondary | ICD-10-CM

## 2024-01-30 DIAGNOSIS — F1090 Alcohol use, unspecified, uncomplicated: Secondary | ICD-10-CM

## 2024-01-30 DIAGNOSIS — R300 Dysuria: Secondary | ICD-10-CM | POA: Insufficient documentation

## 2024-01-30 DIAGNOSIS — K5900 Constipation, unspecified: Secondary | ICD-10-CM

## 2024-01-30 DIAGNOSIS — K7460 Unspecified cirrhosis of liver: Secondary | ICD-10-CM | POA: Diagnosis not present

## 2024-01-30 NOTE — Patient Instructions (Addendum)
 Please continue lactulose, goal of 2-3 soft stools daily.   You can take Linzess once daily for next few days to get stools moving and help along the lactulose.  Samples provided.   Complete CT scan.  Complete labs.   We will be in touch with results as available.

## 2024-01-30 NOTE — Progress Notes (Signed)
 GI Office Note    Referring Provider: Tobie Guy, DO Primary Care Physician:  Tobie Guy, DO  Primary Gastroenterologist: Ozell Hollingshead, MD   Chief Complaint   Chief Complaint  Patient presents with   New Patient (Initial Visit)    Referred for elevated LFTs. Hep C    History of Present Illness   Monica [name] is a 68 y.o. female presenting today for elevated LFTs, Hep C, cirrhosis. Last seen by our pratice in 2019. Seen for Hep C at that time.  Originally received referral for elevated LFTs, Hep C in 09/2023 at the request of Dr. Guy Tobie. Unable to reach patient to make appointment. Now with referral for alcoholic cirrhosis and chronic Hep C at request of Lauraine Fus, NP with [facility] in [place].  Today:  Presents with daughter, Corean. Patient has been in/out of hospital for past one year. She has history of noncompliance in part due to mental status and intermittent confusion. She lives alone, son comes by intermittently. She is a fall risk per [place]. She has fallen multiple times in the past few weeks. They were only able to get an aid for two hours a day during the week. She has had hallucinations, specifically reported seeing bugs but none seen by family. Last seen by NP Griffiths 01/16/24. Noted increased itching and confusion and hallucinations. Increased lactulose. Ammonia level checked and was <10. She has had ammonia level in the 60s several months back.   Patient has known about her Hep C for years. She has never been treated. She has been clean from drugs for 3 years. She admits that she used drugs just prior to admission in 09/2023, blamed it on her son who brought it home. She reports having a DUI in the 1990s but denies history of heavy or daily etoh use. Her father was an alcoholic.   She was noted to have cirrhosis at time of her cholecystectomy in 01/2023. Liver was grossly nodular. No liver biopsy obtained.   Patient reports appetite  varies, can be poor. Since being on lactulose she has nausea and doesn't enjoy food. She has lost about 10 pounds in the past several months. Had episode of vomiting over the weekend. Has chronic history of pain in the left abdomen. She notes with recent falls she has also had some tenderness of left anterior ribs. Last weekend she laid around the whole time due to pain. Thought she felt better yesterday. Went outside for [employer]. Last nigth had increased left flank/left hip pain. Also feels like a broke rib. Usually has BM once daily really well. Few days ago, several BMs. Felt relief and felt like she had room for food. Currently reports taking lactulose 40cc TID and no BM in 2 days. Daughter has concerns that mom may not be taking lactulose as she reports. No melena, brbpr. She has noted urine smells bad for past two days. Has history of UTIs.  Labs 01/16/2024: Hgb 13.6, Plt 166, Na 148, K 3.2, cre 0.8, Tbili 1.2, AST 122, ALT 127, AP 115, ammonia <10, INR 1.14  08/2023: ammonia 65H, AST 309, ALT 309   CT A/P without contrast 01/2023: 1. No acute abnormality in the abdomen or pelvis.  2. Mildly distended urinary bladder.  No mural thickening.  3. Markedly distended gallbladder with cholelithiasis. If there is  clinical concern for acute cholecystitis, consider further  evaluation with right upper quadrant ultrasound examination.  4. Aortic Atherosclerosis (ICD10-I70.0) and Emphysema (  ICD10-J43.9).   Ruq u/s 01/2023: -hydropic appearing gb with small stones. No wall thickening but with diffuse abd pain with transducer palpation.  -findings equivocal for acute cholecystitis -mild CBD enlargement -slight surface nodularity of liver raising possibility of cirrhosis  Colonoscopy 03/2018: -internal hemorrhoids -one 5mm polyp at hepatic flexure, tubular adenoma -next colonoscopy in 7 years  Medications   Current Outpatient Medications  Medication Sig Dispense Refill   Cholecalciferol (VITAMIN D3)  125 MCG (5000 UT) TABS Take 5,000 Units by mouth in the morning.     Dulaglutide (TRULICITY) 4.5 MG/0.5ML SOAJ Inject into the skin.     furosemide  (LASIX ) 20 MG tablet Take 20 mg by mouth 2 (two) times daily.     gabapentin  (NEURONTIN ) 300 MG capsule TAKE ONE CAPSULE BY MOUTH THREE TIMES DAILY. 90 capsule 0   lactulose (CHRONULAC) 10 GM/15ML solution Take 40 mLs by mouth 3 (three) times daily.     levothyroxine  (SYNTHROID , LEVOTHROID) 75 MCG tablet TAKE ONE TABLET BY MOUTH DAILY BEFORE BREAKFAST. 30 tablet 3   LORazepam (ATIVAN) 0.5 MG tablet Take 0.5 mg by mouth daily.     Magnesium 500 MG TABS Take 500 mg by mouth in the morning.     methocarbamol  (ROBAXIN ) 500 MG tablet Take 500 mg by mouth 3 (three) times daily as needed for muscle spasms.     metoprolol  succinate (TOPROL -XL) 25 MG 24 hr tablet Take 12.5 mg by mouth daily after breakfast.     Multiple Vitamin (MULTIVITAMIN WITH MINERALS) TABS tablet Take 1 tablet by mouth in the morning.     Potassium 99 MG TABS Take 99 mg by mouth in the morning.     PROAIR  HFA 108 (90 Base) MCG/ACT inhaler INHALE TWO PUFFS BY MOUTH EVERY 4 TO 6 HOURS AS NEEDED 8.5 g 2   No current facility-administered medications for this visit.    Allergies   Allergies as of 01/30/2024   (No Known Allergies)    Past Medical History   Past Medical History:  Diagnosis Date   Anxiety    Asthma    Bipolar disorder (HCC)    COPD (chronic obstructive pulmonary disease) (HCC)    Dyspnea    Heart murmur    09/07/20 echo ([facility]): LVEF 55%, mild AV sclerosis, mild AI, mild MR   Hepatitis C    History of drug abuse (HCC)    Hypertension    Hypothyroidism    Pneumonia    Thyroid  disease     Past Surgical History   Past Surgical History:  Procedure Laterality Date   ANTERIOR CERVICAL DECOMP/DISCECTOMY FUSION N/A 05/07/2021   Procedure: Cervical 4-5 Cervical 5-6 Cervical 6-7 Anterior cervical decompression/discectomy/fusion;  Surgeon: Cheryle Debby LABOR,  MD;  Location: [facility] OR;  Service: Neurosurgery;  Laterality: N/A;   CHOLECYSTECTOMY     COLONOSCOPY WITH PROPOFOL  N/A 03/26/2018   Procedure: COLONOSCOPY WITH PROPOFOL ;  Surgeon: Shaaron Lamar HERO, MD;  [facility];  Service: Endoscopy;  Laterality: N/A;  11:15am   POLYPECTOMY  03/26/2018   Procedure: POLYPECTOMY;  Surgeon: Shaaron Lamar HERO, MD;  [facility];  Service: Endoscopy;;  splenic flexure   TONSILLECTOMY     as a child   TUBAL LIGATION      Past Family History   Family History  Problem Relation Age of Onset   Lung cancer Mother    Heart disease Father    Hypertension Sister    Diabetes Maternal Grandmother    Stroke Maternal  Grandmother    Colon cancer Neg Hx     Past Social History   Social History   Socioeconomic History   Marital status: Divorced    Spouse name: Not on file   Number of children: Not on file   Years of education: Not on file   Highest education level: Not on file  Occupational History   Not on file  Tobacco Use   Smoking status: Every Day    Current packs/day: 1.00    Average packs/day: 1 pack/day for 45.0 years (45.0 ttl pk-yrs)    Types: Cigarettes   Smokeless tobacco: Never  Vaping Use   Vaping status: Some Days  Substance and Sexual Activity   Alcohol use: Not Currently    Comment: denies heavy or daily use, DUI in 1990s   Drug use: Not Currently    Types: Cocaine, Marijuana, Methamphetamines    Comment: last used in 2022   Sexual activity: Not Currently    Birth control/protection: Surgical  Other Topics Concern   Not on file  Social History Narrative   Has two children. Divorced. 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 Worked Holiday representative. Smokes cigarettes. Eats all food groups.    Social Drivers of Corporate investment banker Strain: Low Risk  (09/12/2023)   Received from [facility]   Overall Financial Resource Strain (CARDIA)    Difficulty of Paying Living Expenses: Not hard at all  Food Insecurity: No Food  Insecurity (09/12/2023)   Received from [facility]   Hunger Vital Sign    Within the past 12 months, you worried that your food would run out before you got the money to buy more.: Never true    Within the past 12 months, the food you bought just didn't last and you didn't have money to get more.: Never true  Transportation Needs: No Transportation Needs (09/12/2023)   Received from [facility] - Transportation    Lack of Transportation (Medical): No    Lack of Transportation (Non-Medical): No  Physical Activity: Inactive (09/12/2023)   Received from [facility]   Exercise Vital Sign    On average, how many days per week do you engage in moderate to strenuous exercise (like a brisk walk)?: 0 days    On average, how many minutes do you engage in exercise at this level?: 0 min  Stress: Stress Concern Present (09/12/2023)   Received from [facility] - Occupational Stress Questionnaire    Feeling of Stress : To some extent  Social Connections: Socially Isolated (09/12/2023)   Received from [facility]   Social Connection and Isolation Panel    In a typical week, how many times do you talk on the phone with family, friends, or neighbors?: Once a week    How often do you get together with friends or relatives?: Never    How often do you attend church or religious services?: Never    Do you belong to any clubs or organizations such as church groups, unions, fraternal or athletic groups, or school groups?: No    How often do you attend meetings of the clubs or organizations you belong to?: Never    Are you married, widowed, divorced, separated, never married, or living with a partner?: Divorced  Intimate Partner Violence: Not At Risk (09/12/2023)   Received from  [facility]   Humiliation, Afraid, Rape, and Kick questionnaire    Within  the last year, have you been afraid of your partner or ex-partner?: No    Within the last  year, have you been humiliated or emotionally abused in other ways by your partner or ex-partner?: No    Within the last year, have you been kicked, hit, slapped, or otherwise physically hurt by your partner or ex-partner?: No    Within the last year, have you been raped or forced to have any kind of sexual activity by your partner or ex-partner?: No    Review of Systems   General: Negative for anorexia,  fever, chills, fatigue, +weakness.see hpi Eyes: Negative for vision changes.  ENT: Negative for hoarseness, difficulty swallowing , nasal congestion. CV: Negative for chest pain, angina, palpitations, dyspnea on exertion, peripheral edema.  Respiratory: Negative for dyspnea at rest, dyspnea on exertion, cough, sputum, wheezing.  GI: See history of present illness. GU:  Negative for dysuria, hematuria, urinary incontinence, urinary frequency, nocturnal urination.  MS: + joint pain. no low back pain.  Derm: Negative for rash or itching.  Neuro: Negative for weakness, abnormal sensation, seizure, frequent headaches, memory loss, _+confusion.  Psych: Negative for anxiety, depression, suicidal ideation, hallucinations.  Endo: Negative for unusual weight change.  Heme: Negative for bruising or bleeding. Allergy: Negative for rash or hives.  Physical Exam   BP (!) 114/56   Pulse 76   Temp 98.6 F (37 C)   Ht 5' 1 (1.549 m)   Wt 110 lb 3.2 oz (50 kg)   BMI 20.82 kg/m    General: chronically ill appearing female in nad. Ambulates slowly with assistance. in no acute distress.  Head: Normocephalic, atraumatic.   Eyes: Conjunctiva pink, no icterus. Mouth: Oropharyngeal mucosa moist and pink  Neck: Supple without thyromegaly, masses, or lymphadenopathy.  Lungs: Clear to auscultation bilaterally.  Heart: Regular rate and rhythm, no murmurs rubs or gallops.  Abdomen: Bowel sounds are normal, nondistended, no hepatosplenomegaly or masses,  no abdominal bruits or hernia, no rebound or  guarding.  Left sided abdominal pain, especially llq Rectal: not performed Extremities: No lower extremity edema. No clubbing or deformities.  Neuro: Alert and oriented x 4 , grossly normal neurologically.  Skin: Warm and dry, no rash or jaundice.   Psych: Alert and cooperative, normal mood and affect.  Labs   See hpi Imaging Studies   No results found.  Assessment/Plan:   Cirrhosis, chronic HCV, elevated LFTs: grossly nodular liver noted at time of gb surgery last year. Imaging at the time by u/s with suggestion of nodular liver border. CT without overt cirrhosis at that time. She has had episodes of confusion including hallucinations which have not always corresponded with elevated ammonia, for example the latest episode earlier this month her ammonia level was <10. She has untreated chronic Hep C. She denies ongoing drug use (except one episode in 09/2023 she was clean for 3 years) or significant etoh history. No etoh at this time. Her AST/ALT have been elevated into the 300 range in the past few months -update labs and u/s -determine liver function, screen for hepatoma. It is not clear regarding candidacy for Hep C treatment at this time, await pending labs -no etoh -no illicit drugs  Left sided abdominal pain: likely multifactorial in setting of constipation, falls. Differential also includes diverticulitis, less likely malignancy. -U/A with culture refluex -labs -CT A/P with contrast -Linzess daily for next few days. -continue lactulose  Constipation: reports taking lactulose 40cc tid without BM in two days. Clinically no symptoms  of obstruction. Query compliance with medication in setting of intermittent confusion -samples of Linzess 145mcg to take daily for next few days to get stools moving -continue lactulose  Dysuria: -u/a with culture reflux     Sonny RAMAN. Ezzard, MHS, PA-C [facility]

## 2024-01-31 ENCOUNTER — Telehealth: Payer: Self-pay | Admitting: *Deleted

## 2024-01-31 NOTE — Telephone Encounter (Signed)
 Called pt and she is aware of CT appt details

## 2024-02-07 ENCOUNTER — Ambulatory Visit (HOSPITAL_COMMUNITY)
Admission: RE | Admit: 2024-02-07 | Discharge: 2024-02-07 | Disposition: A | Discharge status: Home / Self Care | Source: Ambulatory Visit | Attending: Gastroenterology | Admitting: Gastroenterology

## 2024-02-07 DIAGNOSIS — R300 Dysuria: Secondary | ICD-10-CM | POA: Diagnosis present

## 2024-02-07 DIAGNOSIS — B182 Chronic viral hepatitis C: Secondary | ICD-10-CM | POA: Diagnosis present

## 2024-02-07 DIAGNOSIS — K5900 Constipation, unspecified: Secondary | ICD-10-CM | POA: Insufficient documentation

## 2024-02-07 DIAGNOSIS — R109 Unspecified abdominal pain: Secondary | ICD-10-CM | POA: Diagnosis present

## 2024-02-07 DIAGNOSIS — K7460 Unspecified cirrhosis of liver: Secondary | ICD-10-CM | POA: Diagnosis present

## 2024-02-07 MED ORDER — IOHEXOL 9 MG/ML PO SOLN: 500.0000 mL | ORAL | Status: AC

## 2024-02-07 MED ORDER — IOHEXOL 350 MG/ML SOLN
75.0000 mL | Freq: Once | INTRAVENOUS | Status: AC | PRN
  Administered 2024-02-07: 75 mL via INTRAVENOUS

## 2024-02-07 MED ORDER — IOHEXOL 300 MG/ML  SOLN: 100.0000 mL | Freq: Once | INTRAMUSCULAR | Status: DC | PRN

## 2024-02-16 DIAGNOSIS — J441 Chronic obstructive pulmonary disease with (acute) exacerbation: Secondary | ICD-10-CM | POA: Diagnosis not present

## 2024-02-16 DIAGNOSIS — J9621 Acute and chronic respiratory failure with hypoxia: Secondary | ICD-10-CM | POA: Diagnosis not present

## 2024-02-26 ENCOUNTER — Ambulatory Visit: Payer: Self-pay | Admitting: Gastroenterology

## 2024-02-26 DIAGNOSIS — R300 Dysuria: Secondary | ICD-10-CM

## 2024-02-26 DIAGNOSIS — B182 Chronic viral hepatitis C: Secondary | ICD-10-CM

## 2024-02-26 DIAGNOSIS — K7460 Unspecified cirrhosis of liver: Secondary | ICD-10-CM

## 2024-03-01 DIAGNOSIS — Z515 Encounter for palliative care: Secondary | ICD-10-CM | POA: Diagnosis not present

## 2024-03-01 DIAGNOSIS — J449 Chronic obstructive pulmonary disease, unspecified: Secondary | ICD-10-CM | POA: Diagnosis not present

## 2024-03-01 DIAGNOSIS — R531 Weakness: Secondary | ICD-10-CM | POA: Diagnosis not present

## 2024-03-01 DIAGNOSIS — R5383 Other fatigue: Secondary | ICD-10-CM | POA: Diagnosis not present

## 2024-03-01 DIAGNOSIS — R0600 Dyspnea, unspecified: Secondary | ICD-10-CM | POA: Diagnosis not present

## 2024-03-01 DIAGNOSIS — K7460 Unspecified cirrhosis of liver: Secondary | ICD-10-CM | POA: Diagnosis not present

## 2024-03-01 DIAGNOSIS — Z9981 Dependence on supplemental oxygen: Secondary | ICD-10-CM | POA: Diagnosis not present

## 2024-03-06 DIAGNOSIS — R5381 Other malaise: Secondary | ICD-10-CM | POA: Diagnosis not present

## 2024-03-07 ENCOUNTER — Other Ambulatory Visit (INDEPENDENT_AMBULATORY_CARE_PROVIDER_SITE_OTHER): Payer: Self-pay

## 2024-03-07 DIAGNOSIS — R7989 Other specified abnormal findings of blood chemistry: Secondary | ICD-10-CM

## 2024-03-07 DIAGNOSIS — B182 Chronic viral hepatitis C: Secondary | ICD-10-CM

## 2024-03-07 DIAGNOSIS — K7460 Unspecified cirrhosis of liver: Secondary | ICD-10-CM

## 2024-03-08 DIAGNOSIS — Z7409 Other reduced mobility: Secondary | ICD-10-CM | POA: Diagnosis not present

## 2024-03-08 DIAGNOSIS — Z789 Other specified health status: Secondary | ICD-10-CM | POA: Diagnosis not present

## 2024-03-14 ENCOUNTER — Ambulatory Visit (HOSPITAL_COMMUNITY): Admission: RE | Admit: 2024-03-14 | Source: Ambulatory Visit

## 2024-03-18 DIAGNOSIS — J9621 Acute and chronic respiratory failure with hypoxia: Secondary | ICD-10-CM | POA: Diagnosis not present

## 2024-04-08 DIAGNOSIS — Z23 Encounter for immunization: Secondary | ICD-10-CM | POA: Diagnosis not present

## 2024-04-08 DIAGNOSIS — Z1231 Encounter for screening mammogram for malignant neoplasm of breast: Secondary | ICD-10-CM | POA: Diagnosis not present

## 2024-04-08 DIAGNOSIS — Z1239 Encounter for other screening for malignant neoplasm of breast: Secondary | ICD-10-CM | POA: Diagnosis not present

## 2024-04-08 DIAGNOSIS — Z780 Asymptomatic menopausal state: Secondary | ICD-10-CM | POA: Diagnosis not present

## 2024-04-08 DIAGNOSIS — J432 Centrilobular emphysema: Secondary | ICD-10-CM | POA: Diagnosis not present

## 2024-04-17 DIAGNOSIS — J441 Chronic obstructive pulmonary disease with (acute) exacerbation: Secondary | ICD-10-CM | POA: Diagnosis not present

## 2024-04-17 DIAGNOSIS — J9621 Acute and chronic respiratory failure with hypoxia: Secondary | ICD-10-CM | POA: Diagnosis not present

## 2024-05-07 DIAGNOSIS — R112 Nausea with vomiting, unspecified: Secondary | ICD-10-CM | POA: Diagnosis not present

## 2024-05-07 DIAGNOSIS — R1084 Generalized abdominal pain: Secondary | ICD-10-CM | POA: Diagnosis not present

## 2024-05-07 DIAGNOSIS — R197 Diarrhea, unspecified: Secondary | ICD-10-CM | POA: Diagnosis not present

## 2024-05-25 ENCOUNTER — Telehealth: Payer: Self-pay | Admitting: Gastroenterology

## 2024-05-25 NOTE — Telephone Encounter (Signed)
 Patient with chronic HCV/cirrhosis. Elevated AFP. She did not complete MRI as scheduled in 03/2024.  Please reach out to [facility] daughter and find out if patient wants to complete work up with MRI to rule out liver tumor. And consider treatment for HCV.  If so, please schedule MRI.

## 2024-05-28 NOTE — Telephone Encounter (Signed)
 Spoke to daughter Corean (DPR on file) and she stated that her mother is ready to proceed with both the MRI and HCV treatment.

## 2024-05-28 NOTE — Telephone Encounter (Signed)
 Spoke with Corean and she stated the MRI appt scheduled would not work. Gave central scheduling number to reschedule for better date/time for her.

## 2024-05-28 NOTE — Addendum Note (Signed)
 Addended by: JEANELL GRAEME RAMAN on: 05/28/2024 11:04 AM   Modules accepted: Orders

## 2024-05-29 NOTE — Telephone Encounter (Signed)
 Noted. Await MRI results.

## 2024-06-03 ENCOUNTER — Ambulatory Visit (HOSPITAL_COMMUNITY): Admission: RE | Admit: 2024-06-03 | Source: Ambulatory Visit

## 2024-07-11 ENCOUNTER — Encounter: Payer: Self-pay | Admitting: Gastroenterology

## 2024-08-11 DEATH — deceased
